# Patient Record
Sex: Male | Born: 1990 | Race: Black or African American | Hispanic: No | Marital: Single | Smoking: Current some day smoker
Health system: Southern US, Community
[De-identification: ages and names within clinical notes are randomized; demographics above are authoritative.]

## PROBLEM LIST (undated history)

## (undated) DIAGNOSIS — J45909 Unspecified asthma, uncomplicated: Secondary | ICD-10-CM

## (undated) HISTORY — PX: HERNIA REPAIR: SHX51

## (undated) HISTORY — PX: TESTICLE SURGERY: SHX794

---
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
## 2014-04-25 ENCOUNTER — Emergency Department (HOSPITAL_BASED_OUTPATIENT_CLINIC_OR_DEPARTMENT_OTHER): Payer: 59

## 2014-04-25 ENCOUNTER — Emergency Department (HOSPITAL_BASED_OUTPATIENT_CLINIC_OR_DEPARTMENT_OTHER)
Admission: EM | Admit: 2014-04-25 | Discharge: 2014-04-26 | Disposition: A | Discharge status: Home / Self Care | Payer: 59 | Attending: Emergency Medicine | Admitting: Emergency Medicine

## 2014-04-25 ENCOUNTER — Encounter (HOSPITAL_BASED_OUTPATIENT_CLINIC_OR_DEPARTMENT_OTHER): Payer: Self-pay

## 2014-04-25 DIAGNOSIS — Z720 Tobacco use: Secondary | ICD-10-CM | POA: Diagnosis not present

## 2014-04-25 DIAGNOSIS — K6289 Other specified diseases of anus and rectum: Secondary | ICD-10-CM | POA: Insufficient documentation

## 2014-04-25 DIAGNOSIS — R1084 Generalized abdominal pain: Secondary | ICD-10-CM | POA: Diagnosis present

## 2014-04-25 DIAGNOSIS — K5900 Constipation, unspecified: Secondary | ICD-10-CM | POA: Diagnosis not present

## 2014-04-25 DIAGNOSIS — F419 Anxiety disorder, unspecified: Secondary | ICD-10-CM | POA: Diagnosis not present

## 2014-04-25 DIAGNOSIS — J45909 Unspecified asthma, uncomplicated: Secondary | ICD-10-CM | POA: Diagnosis not present

## 2014-04-25 HISTORY — DX: Unspecified asthma, uncomplicated: J45.909

## 2014-04-25 LAB — COMPREHENSIVE METABOLIC PANEL
ALBUMIN: 4.2 g/dL (ref 3.5–5.2)
ALK PHOS: 40 U/L (ref 39–117)
ALT: 31 U/L (ref 0–53)
ANION GAP: 7 (ref 5–15)
AST: 30 U/L (ref 0–37)
BILIRUBIN TOTAL: 0.7 mg/dL (ref 0.3–1.2)
BUN: 9 mg/dL (ref 6–23)
CHLORIDE: 102 mmol/L (ref 96–112)
CO2: 30 mmol/L (ref 19–32)
Calcium: 9.2 mg/dL (ref 8.4–10.5)
Creatinine, Ser: 0.85 mg/dL (ref 0.50–1.35)
GFR calc Af Amer: >90 mL/min (ref >90)
Glucose, Bld: 93 mg/dL (ref 70–99)
POTASSIUM: 3.6 mmol/L (ref 3.5–5.1)
Sodium: 139 mmol/L (ref 135–145)
Total Protein: 8 g/dL (ref 6.0–8.3)

## 2014-04-25 LAB — CBC WITH DIFFERENTIAL/PLATELET
BASOS PCT: 0 % (ref 0–1)
Basophils Absolute: 0 10*3/uL (ref 0.0–0.1)
Eosinophils Absolute: 0.4 10*3/uL (ref 0.0–0.7)
Eosinophils Relative: 3 % (ref 0–5)
HEMATOCRIT: 40.4 % (ref 39.0–52.0)
HEMOGLOBIN: 13.2 g/dL (ref 13.0–17.0)
LYMPHS PCT: 27 % (ref 12–46)
Lymphs Abs: 3.6 10*3/uL (ref 0.7–4.0)
MCH: 29.7 pg (ref 26.0–34.0)
MCHC: 32.7 g/dL (ref 30.0–36.0)
MCV: 90.8 fL (ref 78.0–100.0)
MONOS PCT: 11 % (ref 3–12)
Monocytes Absolute: 1.5 10*3/uL — ABNORMAL HIGH (ref 0.1–1.0)
NEUTROS ABS: 7.8 10*3/uL — AB (ref 1.7–7.7)
Neutrophils Relative %: 59 % (ref 43–77)
Platelets: 390 10*3/uL (ref 150–400)
RBC: 4.45 MIL/uL (ref 4.22–5.81)
RDW: 12.2 % (ref 11.5–15.5)
WBC: 13.3 10*3/uL — AB (ref 4.0–10.5)

## 2014-04-25 LAB — URINALYSIS, ROUTINE W REFLEX MICROSCOPIC
Glucose, UA: NEGATIVE mg/dL
Hgb urine dipstick: NEGATIVE
KETONES UR: 15 mg/dL — AB
LEUKOCYTES UA: NEGATIVE
NITRITE: NEGATIVE
Protein, ur: 30 mg/dL — AB
Specific Gravity, Urine: 1.03 (ref 1.005–1.030)
UROBILINOGEN UA: 0.2 mg/dL (ref 0.0–1.0)
pH: 7 (ref 5.0–8.0)

## 2014-04-25 LAB — URINE MICROSCOPIC-ADD ON

## 2014-04-25 MED ORDER — SODIUM CHLORIDE 0.9 % IV BOLUS (SEPSIS)
1000.0000 mL | Freq: Once | INTRAVENOUS | Status: AC
  Administered 2014-04-25: 1000 mL via INTRAVENOUS

## 2014-04-25 MED ORDER — METRONIDAZOLE 500 MG PO TABS
500.0000 mg | ORAL_TABLET | Freq: Once | ORAL | Status: AC
Start: 2014-04-25 — End: 2014-04-25
  Administered 2014-04-25: 500 mg via ORAL
  Filled 2014-04-25: qty 1

## 2014-04-25 MED ORDER — CIPROFLOXACIN HCL 500 MG PO TABS
500.0000 mg | ORAL_TABLET | Freq: Once | ORAL | Status: AC
  Administered 2014-04-25: 500 mg via ORAL
  Filled 2014-04-25: qty 1

## 2014-04-25 MED ORDER — METRONIDAZOLE 500 MG PO TABS: 500.0000 mg | ORAL_TABLET | Freq: Three times a day (TID) | ORAL | Status: AC

## 2014-04-25 MED ORDER — IOHEXOL 300 MG/ML  SOLN
100.0000 mL | Freq: Once | INTRAMUSCULAR | Status: AC | PRN
  Administered 2014-04-25: 100 mL via INTRAVENOUS

## 2014-04-25 MED ORDER — IOHEXOL 300 MG/ML  SOLN
25.0000 mL | Freq: Once | INTRAMUSCULAR | Status: AC | PRN
  Administered 2014-04-25: 25 mL via ORAL

## 2014-04-25 MED ORDER — ALBUTEROL SULFATE HFA 108 (90 BASE) MCG/ACT IN AERS
1.0000 | INHALATION_SPRAY | Freq: Once | RESPIRATORY_TRACT | Status: AC
  Administered 2014-04-25: 1 via RESPIRATORY_TRACT
  Filled 2014-04-25: qty 6.7

## 2014-04-25 MED ORDER — CIPROFLOXACIN HCL 500 MG PO TABS: 500.0000 mg | ORAL_TABLET | Freq: Two times a day (BID) | ORAL | Status: AC

## 2014-04-25 NOTE — ED Notes (Signed)
After getting iv contrast pt states was having diff breathing,  md notified,  Pt requested an inhaletr,  Received order from md for same

## 2014-04-25 NOTE — Discharge Instructions (Signed)
Proctitis °Proctitis is the swelling and soreness (inflammation) of the lining of the rectum. The rectum is at the end of the large intestine and is attached to the anus. The inflammation causes pain and discomfort. It may be short-term (acute) or long-lasting (chronic). °CAUSES °Inflammation in the rectum can be caused by many things, such as: °· Sexually transmitted diseases (STDs). °· Infection. °· Anal-rectal trauma or injury. °· Ulcerative colitis or Crohn's disease. °· Radiation therapy directed near the rectum. °· Antibiotic therapy. °SYMPTOMS °· Sudden, uncomfortable, and frequent urge to have a bowel movement. °· Anal or rectal pain. °· Abdominal cramping or pain. °· Sensation that the rectum is full. °· Rectal bleeding. °· Pus or mucus discharge from anus. °· Diarrhea or frequent soft, loose stools. °DIAGNOSIS °Diagnosis may include the following: °· A history and physical exam. °· An STD test. °· Blood tests. °· Stool tests. °· Rectal culture. °· A procedure to evaluate the anal canal (anoscopy). °· Procedures to look at part, or the entire large bowel (sigmoidoscopy, colonoscopy). °TREATMENT °Treatment of proctitis depends on the cause. Reducing the symptoms of inflammation and eliminating infection are the main goals of treatment. Treatment may include: °· Home remedies and lifestyle, such as sitz baths and avoiding food right before bedtime. °· Topical ointments, foams, suppositories, or enemas, such as corticosteroids or anti-inflammatories. °· Antibiotic or antiviral medicines to treat infection or to control harmful bacteria. °· Medicines to control diarrhea, soften stools, and reduce pain. °· Medicines to suppress the immune system. °· Avoiding the activity that caused rectal trauma. °· Nutritional, dietary, or herbal supplements. °· Heat or laser therapy for persistent bleeding. °· A dilation procedure to enlarge a narrowed rectum. °· Surgery, though rare, may be necessary to repair damaged rectal  lining. °HOME CARE INSTRUCTIONS °Only take medicines that are recommended or approved by your caregiver. Do not take anti-diarrhea medicine without your caregiver's approval. °SEEK MEDICAL CARE IF: °· You often experience one or more of the symptoms noted above. °· You keep experiencing symptoms after treatment. °· You have questions or concerns about your symptoms or treatment plan. °MAKE SURE YOU: °· Understand these instructions. °· Will watch your condition. °· Will get help right away if you are not doing well or get worse. °FOR MORE INFORMATION °National Institute of Diabetes and Digestive and Kidney Disease (NIDDK): www.digestive.niddk.hih.gov/ °Document Released: 12/11/2010 Document Revised: 04/18/2012 Document Reviewed: 12/11/2010 °ExitCare® Patient Information ©2015 [employer]. This information is not intended to replace advice given to you by your health care provider. Make sure you discuss any questions you have with your health care provider. ° °

## 2014-04-25 NOTE — ED Notes (Addendum)
MD at bedside discussing pts sore and swollen throat and Dc plan of care.  Pt swallowed meds without difficulty.

## 2014-04-25 NOTE — ED Notes (Signed)
Pt back from CT. Pt threw up in CT and got really upset. Pt complaining that he cant breath. Pt speaking complete sentences. Clear bilateral breath sounds. Pt in no distress. Md ordered albuterol inhaler.

## 2014-04-25 NOTE — ED Provider Notes (Signed)
CSN: [id number]     Arrival date & time 04/25/14  1829 History  This chart was scribed for Tilden FossaElizabeth Cordell Guercio, MD by Phillis HaggisGabriella Gaje, ED Scribe. This patient was seen in room MH08/MH08 and patient care was started at 8:24 PM.     Chief Complaint  Patient presents with  . Abdominal Pain   Patient is a 24 y.o. male presenting with abdominal pain. The history is provided by the patient. No language interpreter was used.  Abdominal Pain Pain location:  Generalized Pain quality: aching   Pain radiates to:  Does not radiate Pain severity:  Moderate Duration:  2 weeks Timing:  Constant Progression:  Worsening Chronicity:  New Worsened by:  Bowel movements Ineffective treatments:  None tried Associated symptoms: constipation   Associated symptoms: no dysuria, no fever, no nausea and no vomiting     HPI Comments: Antonio Wong is a 24 y.o. male who presents to the Emergency Department complaining of constant worsening generalized abdominal pain onset two weeks ago. He states that the pain is constant, even when he sleeps. He states that the pain is worst with bowel movements. He states that his last bowel movement was yesterday and that he has been having hard stools. He states that he has some rectal pain but it is mostly abdominal pain. Patient denies pain with urination, fever, or vomiting.  He denies trying anything for the pain. He states that he has not had any surgeries to the abdomen. Patient reports history of asthma.    Past Medical History  Diagnosis Date  . Asthma    Past Surgical History  Procedure Laterality Date  . Testicle surgery     No family history on file. History  Substance Use Topics  . Smoking status: Current Some Day Smoker    Types: Cigarettes  . Smokeless tobacco: Not on file  . Alcohol Use: No    Review of Systems  Constitutional: Negative for fever.  Gastrointestinal: Positive for abdominal pain, constipation and rectal pain. Negative for nausea and vomiting.   Genitourinary: Negative for dysuria and difficulty urinating.  All other systems reviewed and are negative.  Allergies  Review of patient's allergies indicates no known allergies.  Home Medications   Prior to Admission medications   Not on File   BP 140/82 mmHg  Pulse 74  Temp(Src) 99.3 F (37.4 C) (Oral)  Resp 18  Ht 5\' 6"  (1.676 m)  Wt 290 lb (131.543 kg)  BMI 46.83 kg/m2  SpO2 100%  Physical Exam  Constitutional: He is oriented to person, place, and time. He appears well-developed and well-nourished.  HENT:  Head: Normocephalic and atraumatic.  Cardiovascular: Normal rate and regular rhythm.   No murmur heard. Pulmonary/Chest: Effort normal and breath sounds normal. No respiratory distress.  Abdominal: Soft. There is no tenderness. There is no rebound and no guarding.  Mild RLQ tenderness  Genitourinary: Rectal exam shows no external hemorrhoid.  Empty rectal vault  Musculoskeletal: He exhibits no edema or tenderness.  Neurological: He is alert and oriented to person, place, and time.  Skin: Skin is warm and dry.  Psychiatric: His behavior is normal. His mood appears anxious.  Nursing note and vitals reviewed.   ED Course  Procedures (including critical care time) DIAGNOSTIC STUDIES: Oxygen Saturation is 100% on room air, normal by my interpretation.    COORDINATION OF CARE: 8:31 PM-Discussed treatment plan which includes rectal exam and labs with pt at bedside and pt agreed to plan.   Labs Review  Labs Reviewed  URINALYSIS, ROUTINE W REFLEX MICROSCOPIC - Abnormal; Notable for the following:    Color, Urine AMBER (*)    APPearance CLOUDY (*)    Bilirubin Urine SMALL (*)    Ketones, ur 15 (*)    Protein, ur 30 (*)    All other components within normal limits  URINE MICROSCOPIC-ADD ON - Abnormal; Notable for the following:    Squamous Epithelial / LPF FEW (*)    All other components within normal limits  CBC WITH DIFFERENTIAL/PLATELET - Abnormal; Notable  for the following:    WBC 13.3 (*)    Neutro Abs 7.8 (*)    Monocytes Absolute 1.5 (*)    All other components within normal limits  COMPREHENSIVE METABOLIC PANEL   Imaging Review Ct Abdomen Pelvis W Contrast  04/25/2014   CLINICAL DATA:  24 year old male with 2 week history of abdominal pain and constipation  EXAM: CT ABDOMEN AND PELVIS WITH CONTRAST  TECHNIQUE: Multidetector CT imaging of the abdomen and pelvis was performed using the standard protocol following bolus administration of intravenous contrast.  CONTRAST:  25mL OMNIPAQUE IOHEXOL 300 MG/ML SOLN, OMNIPAQUE IOHEXOL 300 MG/ML SOLN  COMPARISON:  Acute abdominal series obtained earlier today  FINDINGS: Lower Chest: The lung bases are clear. Visualized cardiac structures are within normal limits for size. No pericardial effusion. Unremarkable visualized distal thoracic esophagus.  Abdomen: Unremarkable CT appearance of the stomach, duodenum, spleen, adrenal glands and pancreas. Normal hepatic contour morphology. No discrete hepatic lesion. Gallbladder is unremarkable. No intra or extrahepatic biliary ductal dilatation.  Unremarkable appearance of the bilateral kidneys. No focal solid lesion, hydronephrosis or nephrolithiasis.  Submucosal edema, haziness and surrounding stranding in the adjacent fat beginning at the rectum and extending to involve the sigmoid colon. No definite diverticula. There is an approximately 1.8 x 1.6 cm intermediate attenuation collection position between the rectum and sigmoid colon consistent with a developing phlegmon. The more proximal sigmoid colon and descending colon are unaffected. Normal appendix in the right lower quadrant. No free fluid or suspicious adenopathy.  Pelvis: Unremarkable bladder. Inflammatory stranding in the perirectal and perisigmoid fat as described above.  Bones/Soft Tissues: No acute fracture or aggressive appearing lytic or blastic osseous lesion.  Vascular: No significant atherosclerotic  vascular disease, aneurysmal dilatation or acute abnormality.  IMPRESSION: CT findings are consistent with proctitis and distal sigmoid colitis. There is an approximately 1.8 x 1.6 cm developing low-attenuation collection interposed between the rectum and the affected loop of adjacent sigmoid colon concerning for developing phlegmon. There is presently no well-defined abscess that would be amenable to image guided drainage. Primary differential considerations include infectious colitis, and inflammatory colitis (ulcerative colitis). Penetrating trauma is thought less likely given the absence of free air.   Electronically Signed   By: Malachy Moan M.D.   On: 04/25/2014 23:01   Dg Abd Acute W/chest  04/25/2014   CLINICAL DATA:  24 year old male with a history of abdominal pain.  EXAM: DG ABDOMEN ACUTE W/ 1V CHEST  COMPARISON:  None.  FINDINGS: Chest:  Cardiomediastinal silhouette within normal limits in size and contour.  No evidence of pulmonary vascular congestion.  No confluent airspace disease, pleural effusion, pneumothorax.  Abdomen:  Gas throughout the length of the colon without large stool burden.  Small amount of central small bowel gas.  Gas within the stomach  Silhouette of the liver, spleen, bilateral kidneys unremarkable.  No unexpected calcifications.  No definite evidence of free air.  No displaced fracture identified.  IMPRESSION: Chest:  No radiographic evidence of acute cardiopulmonary disease.  Abdomen:  Normal bowel gas pattern.  Signed,  Yvone Neu. Loreta Ave, DO  [facility]  [facility]   Electronically Signed   By: Gilmer Mor D.O.   On: 04/25/2014 21:39     EKG Interpretation None      MDM   Final diagnoses:  Proctitis   Patient here for evaluation of rectal pain and abdominal pain. Patient is not significantly tender on rectal exam. Patient denies any sort of rectal intercourse, penile discharge, rectal foreign bodies. Discussed  with patient finding of CT scan with findings concerning for colitis, there is questioning phlegmon but no evidence of abscess. Discussed with patient importance of starting antibiotics with close gastroenterology and PCP follow-up. Return precautions were discussed for progressive symptoms.  I personally performed the services described in this documentation, which was scribed in my presence. The recorded information has been reviewed and is accurate.    Tilden Fossa, MD 04/26/14 [phone]

## 2014-04-25 NOTE — ED Notes (Signed)
C/o abd pain  When having bm, states doesn't feel like emptying bladder w urination x 2 weeks

## 2014-04-25 NOTE — ED Notes (Signed)
Pt reports for two weeks been having abdominal pain and diffculty having a bowel movement.  Pt reports he took a laxative and had a bowel movement today but when he pushes to have a bowel movement he has severe abdominal pain.  Reprots pain also when he coughs.

## 2014-04-26 LAB — GC/CHLAMYDIA PROBE AMP (~~LOC~~) NOT AT ARMC
CHLAMYDIA, DNA PROBE: NEGATIVE
NEISSERIA GONORRHEA: NEGATIVE

## 2014-05-11 ENCOUNTER — Encounter (HOSPITAL_BASED_OUTPATIENT_CLINIC_OR_DEPARTMENT_OTHER): Payer: Self-pay

## 2014-05-11 ENCOUNTER — Emergency Department (HOSPITAL_BASED_OUTPATIENT_CLINIC_OR_DEPARTMENT_OTHER): Payer: 59

## 2014-05-11 ENCOUNTER — Emergency Department (HOSPITAL_BASED_OUTPATIENT_CLINIC_OR_DEPARTMENT_OTHER)
Admission: EM | Admit: 2014-05-11 | Discharge: 2014-05-11 | Disposition: A | Discharge status: Home / Self Care | Payer: 59 | Attending: Emergency Medicine | Admitting: Emergency Medicine

## 2014-05-11 DIAGNOSIS — S40022A Contusion of left upper arm, initial encounter: Secondary | ICD-10-CM | POA: Insufficient documentation

## 2014-05-11 DIAGNOSIS — Z720 Tobacco use: Secondary | ICD-10-CM | POA: Diagnosis not present

## 2014-05-11 DIAGNOSIS — S59912A Unspecified injury of left forearm, initial encounter: Secondary | ICD-10-CM | POA: Diagnosis present

## 2014-05-11 DIAGNOSIS — Z792 Long term (current) use of antibiotics: Secondary | ICD-10-CM | POA: Insufficient documentation

## 2014-05-11 DIAGNOSIS — Y9389 Activity, other specified: Secondary | ICD-10-CM | POA: Diagnosis not present

## 2014-05-11 DIAGNOSIS — W228XXA Striking against or struck by other objects, initial encounter: Secondary | ICD-10-CM | POA: Diagnosis not present

## 2014-05-11 DIAGNOSIS — Y998 Other external cause status: Secondary | ICD-10-CM | POA: Insufficient documentation

## 2014-05-11 DIAGNOSIS — Y9289 Other specified places as the place of occurrence of the external cause: Secondary | ICD-10-CM | POA: Insufficient documentation

## 2014-05-11 DIAGNOSIS — J45909 Unspecified asthma, uncomplicated: Secondary | ICD-10-CM | POA: Insufficient documentation

## 2014-05-11 DIAGNOSIS — Z79899 Other long term (current) drug therapy: Secondary | ICD-10-CM | POA: Insufficient documentation

## 2014-05-11 NOTE — ED Provider Notes (Signed)
CSN: [id number]     Arrival date & time 05/11/14  1436 History   First MD Initiated Contact with Patient 05/11/14 1612     Chief Complaint  Patient presents with  . Arm Injury     (Consider location/radiation/quality/duration/timing/severity/associated sxs/prior Treatment) HPI  Antonio Wong is a 24 y.o. male presenting with left arm pain and edema and erythema after being hit with a forklift at work 05/09/14. Patient states the pain is worse at night and with palpation. He is not taken anything for his symptoms. Patient denies other injury. He denies fevers, chills, nausea, vomiting, tingling or weakness. Pt states he was terminated from his job yesterday because he did not get UDS after the incident.    Past Medical History  Diagnosis Date  . Asthma    Past Surgical History  Procedure Laterality Date  . Testicle surgery    . Hernia repair     History reviewed. No pertinent family history. History  Substance Use Topics  . Smoking status: Current Some Day Smoker    Types: Cigarettes  . Smokeless tobacco: Not on file  . Alcohol Use: No    Review of Systems  Musculoskeletal: Positive for myalgias. Negative for joint swelling.  Skin: Positive for color change. Negative for wound.  Neurological: Negative for weakness and numbness.      Allergies  Review of patient's allergies indicates no known allergies.  Home Medications   Prior to Admission medications   Medication Sig Start Date End Date Taking? Authorizing Provider  ciprofloxacin (CIPRO) 500 MG tablet Take 1 tablet (500 mg total) by mouth 2 (two) times daily. 04/25/14   Antonio FossaElizabeth Rees, MD  metroNIDAZOLE (FLAGYL) 500 MG tablet Take 1 tablet (500 mg total) by mouth 3 (three) times daily. 04/25/14   Antonio FossaElizabeth Rees, MD   BP 139/87 mmHg  Pulse 74  Temp(Src) 98.1 F (36.7 C) (Oral)  Resp 18  Ht 5\' 5"  (1.651 m)  Wt 244 lb 4.8 oz (110.814 kg)  BMI 40.65 kg/m2  SpO2 100% Physical Exam  Constitutional: He appears  well-developed and well-nourished. No distress.  HENT:  Head: Normocephalic and atraumatic.  Eyes: Conjunctivae are normal. Right eye exhibits no discharge. Left eye exhibits no discharge.  Cardiovascular:  2+ radial pulses equal bilaterally.  Pulmonary/Chest: Effort normal. No respiratory distress.  Musculoskeletal:  Large contusion to left distal upper extremity. Contusion is not circumferential. Compartment is soft. No abrasion, wound. Sensation intact. 5 out of 5 strength in bilateral upper extremities is equal. Full range of motion of left fingers, wrist, elbow without pain.  Neurological: He is alert. Coordination normal.  Skin: He is not diaphoretic.  Psychiatric: He has a normal mood and affect. His behavior is normal.  Nursing note and vitals reviewed.   ED Course  Procedures (including critical care time) Labs Review Labs Reviewed - No data to display  Imaging Review Dg Forearm Left  05/11/2014   CLINICAL DATA:  Right forearm pain, bruising and swelling after being hit by a forklift 2 days ago.  EXAM: LEFT FOREARM - 2 VIEW  COMPARISON:  None.  FINDINGS: Diffuse soft tissue swelling medially and dorsally. No fracture or dislocation seen.  IMPRESSION: No fracture.   Electronically Signed   By: Beckie SaltsSteven  Reid M.D.   On: 05/11/2014 15:21     EKG Interpretation None      MDM   Final diagnoses:  Contusion of left upper extremity, initial encounter   Patient X-Ray negative for obvious fracture or dislocation.  Neurovascularly intact. Contusion not circumferential compartment soft. I doubt septic arthritis and compartment syndrome. Pt advised to follow up with PCP/orthopedics if symptoms persist. Patient given ace bandage while in ED, RICE, ibuprofen and conservative therapy recommended and discussed.   Discussed return precautions with patient including paresthesias, pallor, firm compartment, weakness. Discussed all results and patient verbalizes understanding and agrees with  plan.    Oswaldo ConroyVictoria Jozef Eisenbeis, PA-C 05/11/14 1715  Rolan BuccoMelanie Belfi, MD 05/11/14 [phone]

## 2014-05-11 NOTE — ED Notes (Signed)
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

## 2014-05-11 NOTE — ED Notes (Signed)
Pt now states that he was terminated from job yesterday.

## 2014-05-11 NOTE — Discharge Instructions (Signed)
Return to the emergency room with worsening of symptoms, new symptoms or with symptoms that are concerning, especially numbness, tingling, firm feeling to upper extremity, decreased sensation, weakness, severe pain. RICE: Rest, Ice (three cycles of 20 mins on, 20mins off at least twice a day), compression/brace, elevation. Heating pad works well for back pain. Ibuprofen 400mg  (2 tablets 200mg ) every 5-6 hours for 3-5 days. Follow up with PCP/orthopedist.  Read below information and follow recommendations.   Contusion A contusion is a deep bruise. Contusions are the result of an injury that caused bleeding under the skin. The contusion may turn blue, purple, or yellow. Minor injuries will give you a painless contusion, but more severe contusions may stay painful and swollen for a few weeks.  CAUSES  A contusion is usually caused by a blow, trauma, or direct force to an area of the body. SYMPTOMS   Swelling and redness of the injured area.  Bruising of the injured area.  Tenderness and soreness of the injured area.  Pain. DIAGNOSIS  The diagnosis can be made by taking a history and physical exam. An X-ray, CT scan, or MRI may be needed to determine if there were any associated injuries, such as fractures. TREATMENT  Specific treatment will depend on what area of the body was injured. In general, the best treatment for a contusion is resting, icing, elevating, and applying cold compresses to the injured area. Over-the-counter medicines may also be recommended for pain control. Ask your caregiver what the best treatment is for your contusion. HOME CARE INSTRUCTIONS   Put ice on the injured area.  Put ice in a plastic bag.  Place a towel between your skin and the bag.  Leave the ice on for 15-20 minutes, 3-4 times a day, or as directed by your health care provider.  Only take over-the-counter or prescription medicines for pain, discomfort, or fever as directed by your caregiver. Your  caregiver may recommend avoiding anti-inflammatory medicines (aspirin, ibuprofen, and naproxen) for 48 hours because these medicines may increase bruising.  Rest the injured area.  If possible, elevate the injured area to reduce swelling. SEEK IMMEDIATE MEDICAL CARE IF:   You have increased bruising or swelling.  You have pain that is getting worse.  Your swelling or pain is not relieved with medicines. MAKE SURE YOU:   Understand these instructions.  Will watch your condition.  Will get help right away if you are not doing well or get worse. Document Released: 10/01/2004 Document Revised: 12/27/2012 Document Reviewed: 10/27/2010 [facility] Patient Information 2015 [place], [place]. This information is not intended to replace advice given to you by your health care provider. Make sure you discuss any questions you have with your health care provider.

## 2014-07-20 ENCOUNTER — Emergency Department (HOSPITAL_BASED_OUTPATIENT_CLINIC_OR_DEPARTMENT_OTHER): Payer: 59

## 2014-07-20 ENCOUNTER — Encounter (HOSPITAL_BASED_OUTPATIENT_CLINIC_OR_DEPARTMENT_OTHER): Payer: Self-pay | Admitting: *Deleted

## 2014-07-20 ENCOUNTER — Emergency Department (HOSPITAL_BASED_OUTPATIENT_CLINIC_OR_DEPARTMENT_OTHER)
Admission: EM | Admit: 2014-07-20 | Discharge: 2014-07-21 | Disposition: A | Discharge status: Home / Self Care | Payer: 59 | Attending: Emergency Medicine | Admitting: Emergency Medicine

## 2014-07-20 DIAGNOSIS — J45909 Unspecified asthma, uncomplicated: Secondary | ICD-10-CM | POA: Diagnosis not present

## 2014-07-20 DIAGNOSIS — Z79899 Other long term (current) drug therapy: Secondary | ICD-10-CM | POA: Diagnosis not present

## 2014-07-20 DIAGNOSIS — Z792 Long term (current) use of antibiotics: Secondary | ICD-10-CM | POA: Diagnosis not present

## 2014-07-20 DIAGNOSIS — M79632 Pain in left forearm: Secondary | ICD-10-CM | POA: Diagnosis not present

## 2014-07-20 DIAGNOSIS — Z720 Tobacco use: Secondary | ICD-10-CM | POA: Insufficient documentation

## 2014-07-20 MED ORDER — IBUPROFEN 800 MG PO TABS
800.0000 mg | ORAL_TABLET | Freq: Once | ORAL | Status: DC
  Filled 2014-07-20: qty 1

## 2014-07-20 NOTE — ED Notes (Addendum)
Left forearm pain. He has a swollen area on his arm that has been there since a mash injury at work a month ago. He was seen here and had a negative xray at the time of the injury.

## 2014-07-20 NOTE — ED Notes (Signed)
Patient c/o 10/10 pain in left arm. States this started with injury at work approximately 1.5 months ago. States area on LFA became swollen "a few weeks ago" a bump appeared under the skin. Area is approximately pea sized and moves freely under skin when pushed. No redness to area, and skin is otherwise intact. Patient states he has taken multiple pain medications given to him by family members, but none today. Also complains of R groin pain 2-3 days, states its "just sore".

## 2014-07-20 NOTE — ED Provider Notes (Signed)
CSN: [id number]     Arrival date & time 07/20/14  2206 History   This chart was scribed for Antonio Booze, MD by Ronney Lion, ED Scribe. This patient was seen in room MH07/MH07 and the patient's care was started at 11:47 PM.    Chief Complaint  Patient presents with  . Arm Pain   The history is provided by the patient. No language interpreter was used.    HPI Comments: Antonio Wong is a 24 y.o. male who presents to the Emergency Department complaining of 9/10 left arm pain following being struck by a forklift at work several months ago. He also complains of a small knot underneath the skin on his left arm. He states the pain is worse at night. He states his uncle gave him Percocet which completely alleviated the pain. Palpation makes his pain worse.   Past Medical History  Diagnosis Date  . Asthma    Past Surgical History  Procedure Laterality Date  . Testicle surgery    . Hernia repair     No family history on file. History  Substance Use Topics  . Smoking status: Current Some Day Smoker    Types: Cigarettes  . Smokeless tobacco: Not on file  . Alcohol Use: No    Review of Systems  Musculoskeletal: Positive for myalgias (left arm pain).  All other systems reviewed and are negative.   Allergies  Review of patient's allergies indicates no known allergies.  Home Medications   Prior to Admission medications   Medication Sig Start Date End Date Taking? Authorizing Provider  ciprofloxacin (CIPRO) 500 MG tablet Take 1 tablet (500 mg total) by mouth 2 (two) times daily. 04/25/14   Tilden Fossa, MD  metroNIDAZOLE (FLAGYL) 500 MG tablet Take 1 tablet (500 mg total) by mouth 3 (three) times daily. 04/25/14   Tilden Fossa, MD   BP 132/67 mmHg  Pulse 66  Temp(Src) 98.5 F (36.9 C) (Oral)  Resp 18  Ht  (1.651 m)  Wt 238 lb 2 oz (108.013 kg)  BMI 39.63 kg/m2  SpO2 100% Physical Exam  Constitutional: He is oriented to person, place, and time. He appears well-developed and  well-nourished. No distress.  HENT:  Head: Normocephalic and atraumatic.  Eyes: EOM are normal. Pupils are equal, round, and reactive to light.  Neck: Normal range of motion. Neck supple. No JVD present.  Cardiovascular: Normal rate, regular rhythm and normal heart sounds.   No murmur heard. Pulmonary/Chest: Effort normal and breath sounds normal. He has no wheezes. He has no rales. He exhibits no tenderness.  Abdominal: Soft. Bowel sounds are normal. He exhibits no distension and no mass. There is no tenderness.  Musculoskeletal: Normal range of motion. He exhibits no edema.  Moderate tenderness ulnar aspect left forearm. Freely moveable subcutaneous nodule on the left, nontender. Distal NVI.   Lymphadenopathy:    He has no cervical adenopathy.  Neurological: He is alert and oriented to person, place, and time. No cranial nerve deficit. He exhibits normal muscle tone. Coordination normal.  Skin: Skin is warm and dry. No rash noted.  Psychiatric: He has a normal mood and affect. His behavior is normal. Judgment and thought content normal.  Nursing note and vitals reviewed.   ED Course  Procedures (including critical care time)  DIAGNOSTIC STUDIES: Oxygen Saturation is 100% on RA, normal by my interpretation.    COORDINATION OF CARE: 11:52 PM - Discussed treatment plan with pt at bedside which includes left arm XR and  anti-inflammatory medication. Will give referral to hand specialist. Pt verbalized understanding and agreed to plan.  Imaging Review Dg Forearm Left  07/21/2014   CLINICAL DATA:  24 year old male with trauma and left forearm pain.  EXAM: LEFT FOREARM - 2 VIEW  COMPARISON:  Radiograph dated 05/11/2014  FINDINGS: There is no fracture or dislocation. Evaluation of the elbow is limited as a true 90 degrees angle lateral projection is not provided. The soft tissues are unremarkable.  IMPRESSION: No acute fracture or dislocation.   Electronically Signed   By: Elgie CollardArash  Radparvar  M.D.   On: 07/21/2014 00:21     MDM   Final diagnoses:  Pain in left forearm    Left forearm pain which is probably related to residual trauma from forklift injury from 2 months ago. Old records reviewed confirming ED visit for contusion of left arm from forklift. Subcutis nodule left arm is of uncertain significance. It does not seem to be interfering with function and have advised the patient to just see if that would resolve over time. Forearm x-rays repeated and shows no evidence of fracture or radiopaque foreign body. He is discharged with prescription for naproxen and tramadol and is referred to hand surgery for follow-up.   I personally performed the services described in this documentation, which was scribed in my presence. The recorded information has been reviewed and is accurate.       Antonio Boozeavid Antonio Bolyard, MD 07/21/14 [phone]

## 2014-07-21 MED ORDER — NAPROXEN 500 MG PO TABS: 500.0000 mg | ORAL_TABLET | Freq: Two times a day (BID) | ORAL | Status: AC

## 2014-07-21 MED ORDER — TRAMADOL HCL 50 MG PO TABS
50.0000 mg | ORAL_TABLET | Freq: Four times a day (QID) | ORAL | Status: AC | PRN
Start: 2014-07-21 — End: ?
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
## 2014-07-21 NOTE — ED Notes (Signed)
Pt refused ibu 800,  States he needs something stronger

## 2015-05-14 IMAGING — CT CT ABD-PELV W/ CM
2 of 4 series · 16 of 46 positions shown, 18 images · IV contrast (APPLIED)
Comparison: Acute abdominal series obtained earlier today

CLINICAL DATA: 24-year-old male with 2 week history of abdominal
pain and constipation

EXAM:
CT ABDOMEN AND PELVIS WITH CONTRAST
TECHNIQUE: Multidetector CT imaging of the abdomen and pelvis was performed
using the standard protocol following bolus administration of
intravenous contrast.
CONTRAST:  25mL OMNIPAQUE IOHEXOL 300 MG/ML SOLN, 100mL OMNIPAQUE
IOHEXOL 300 MG/ML SOLN

[Series 2: abd/pelvis 5.0 b31f · axial · 0.85mm/px · z∈[-214,+206]mm · 13 of 93 slices shown, 15 images]
[im 5/93  soft-tissue]
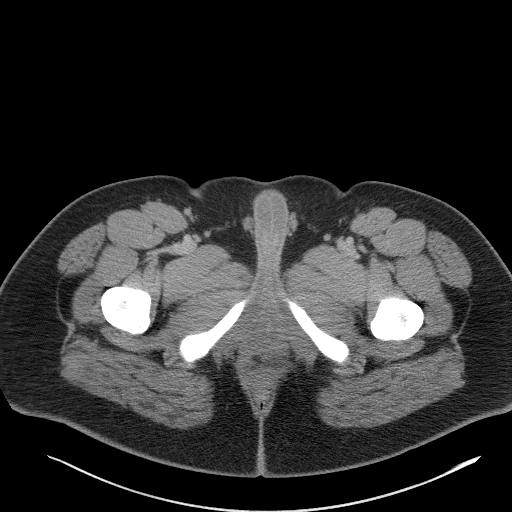
[im 5/93  bone]
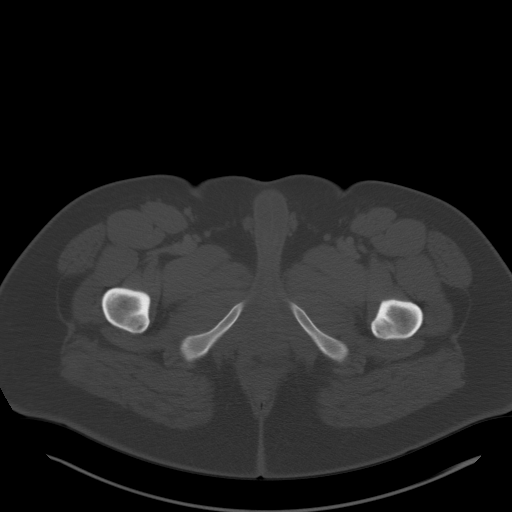
[im 13/93  soft-tissue]
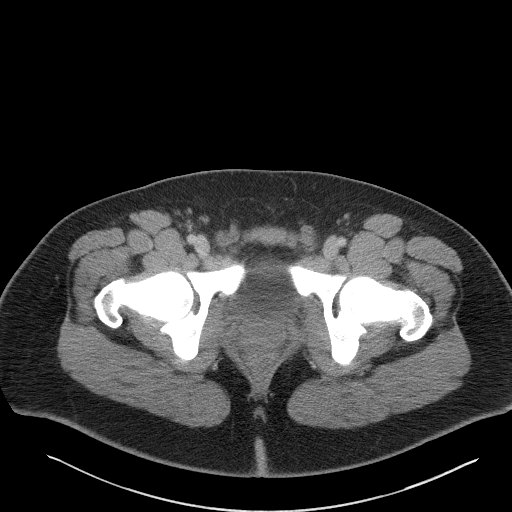
[im 21/93  soft-tissue]
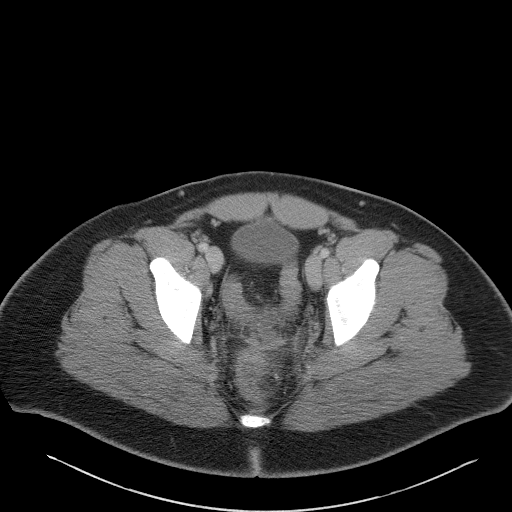
[im 25/93  soft-tissue]
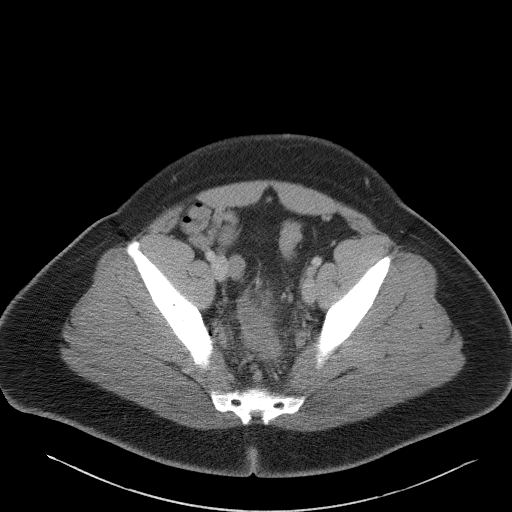
[im 33/93  soft-tissue]
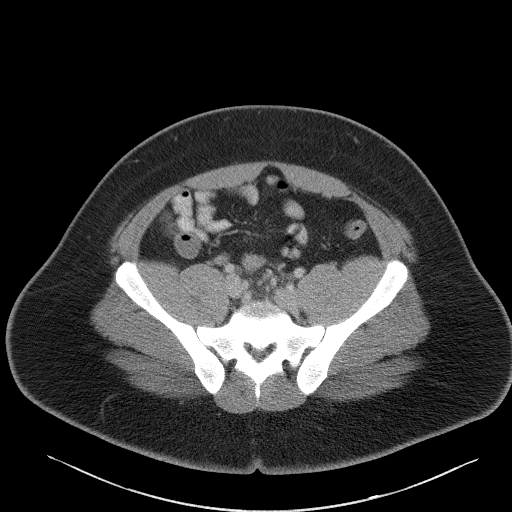
[im 41/93  soft-tissue]
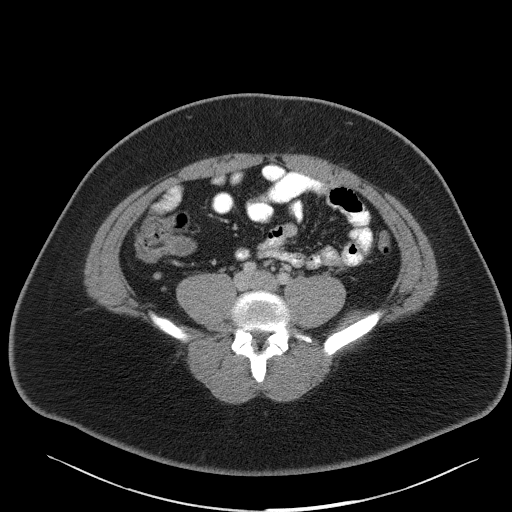
[im 49/93  soft-tissue]
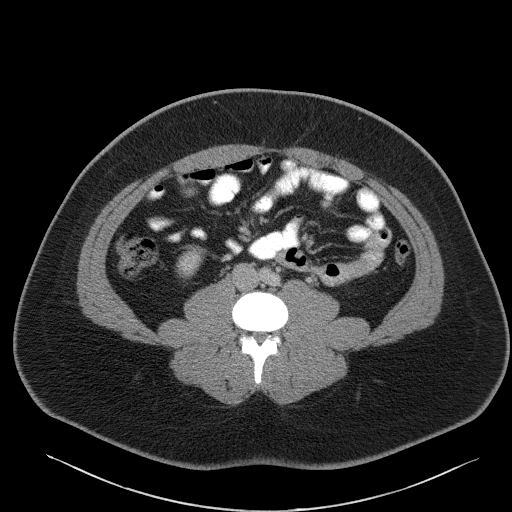
[im 53/93  soft-tissue]
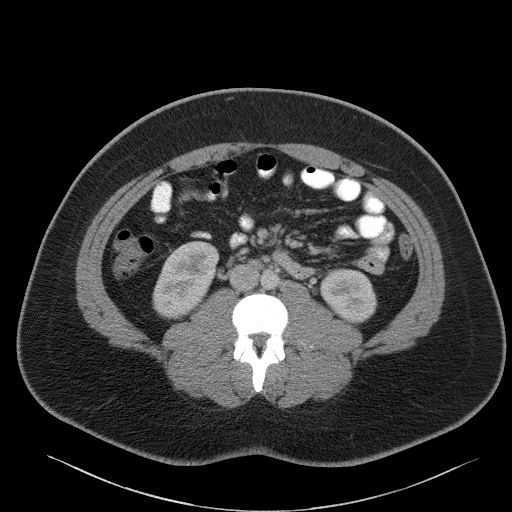
[im 61/93  soft-tissue]
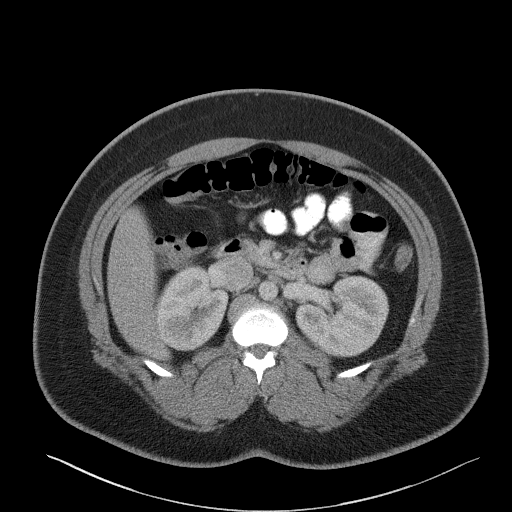
[im 61/93  bone]
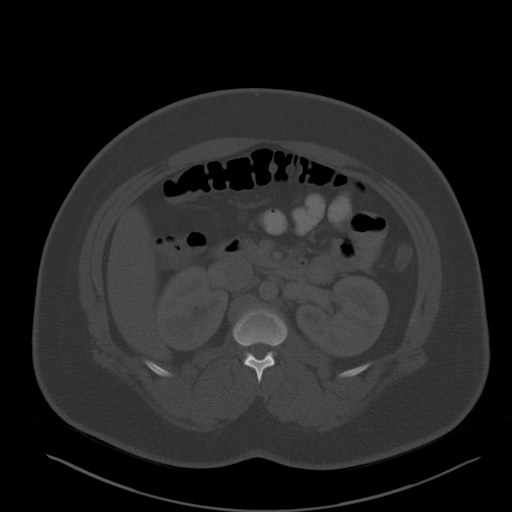
[im 69/93  soft-tissue]
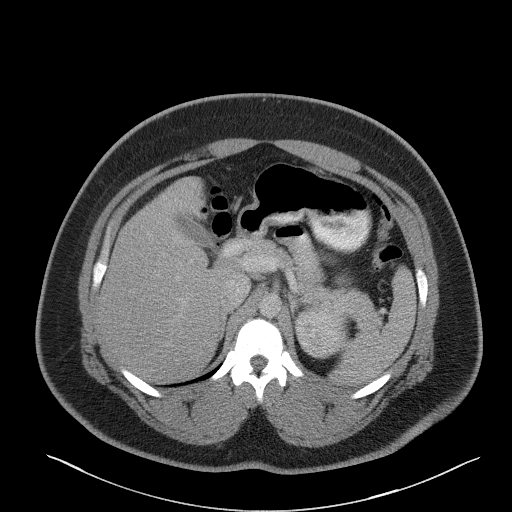
[im 73/93  soft-tissue]
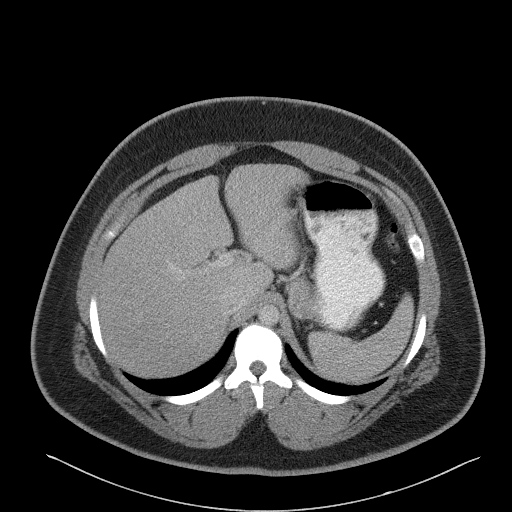
[im 81/93  soft-tissue]
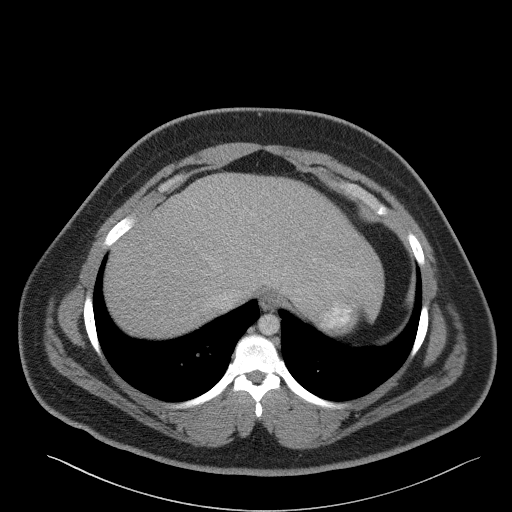
[im 89/93  soft-tissue]
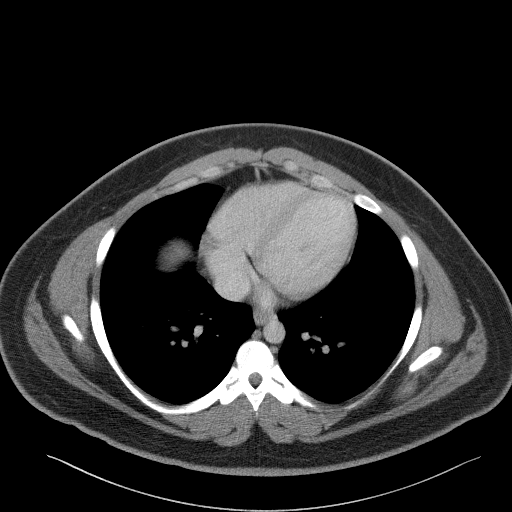

[Series 5: abd/pelvis 3.0 coronal · coronal · 0.82mm/px · 3 of 101 slices shown]
[im 34/101  soft-tissue]
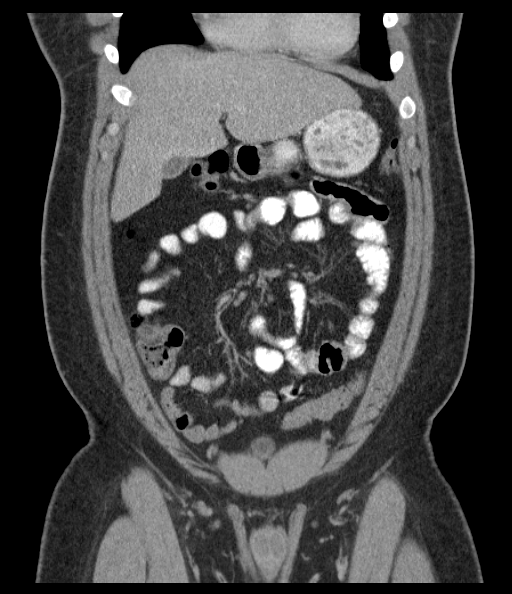
[im 45/101  soft-tissue]
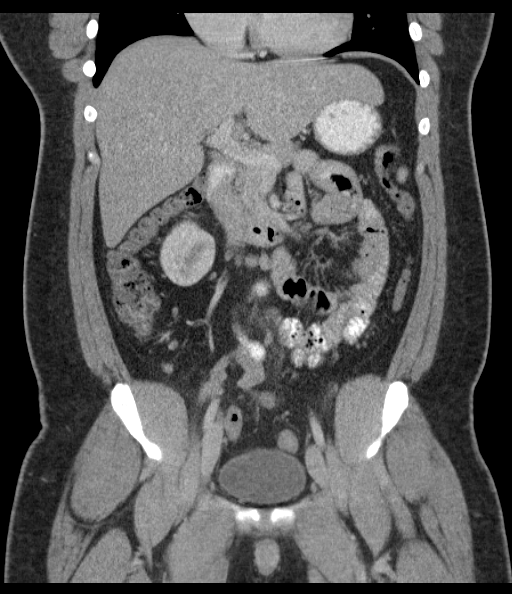
[im 56/101  soft-tissue]
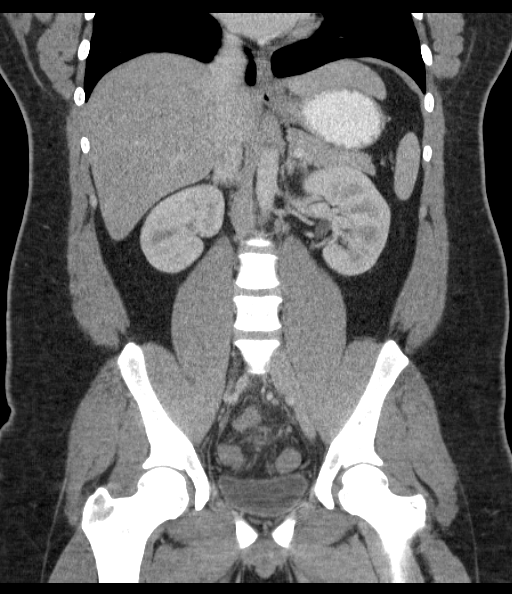

[16 of 46 positions shown; findings below may reference images not displayed]

FINDINGS: Lower Chest: The lung bases are clear. Visualized cardiac structures
are within normal limits for size. No pericardial effusion.
Unremarkable visualized distal thoracic esophagus.

Abdomen: Unremarkable CT appearance of the stomach, duodenum,
spleen, adrenal glands and pancreas. Normal hepatic contour
morphology. No discrete hepatic lesion. Gallbladder is unremarkable.
No intra or extrahepatic biliary ductal dilatation.

Unremarkable appearance of the bilateral kidneys. No focal solid
lesion, hydronephrosis or nephrolithiasis.

Submucosal edema, haziness and surrounding stranding in the adjacent
fat beginning at the rectum and extending to involve the sigmoid
colon. No definite diverticula. There is an approximately 1.8 x
cm intermediate attenuation collection position between the rectum
and sigmoid colon consistent with a developing phlegmon. The more
proximal sigmoid colon and descending colon are unaffected. Normal
appendix in the right lower quadrant. No free fluid or suspicious
adenopathy.

Pelvis: Unremarkable bladder. Inflammatory stranding in the
perirectal and perisigmoid fat as described above.

Bones/Soft Tissues: No acute fracture or aggressive appearing lytic
or blastic osseous lesion.

Vascular: No significant atherosclerotic vascular disease,
aneurysmal dilatation or acute abnormality.
IMPRESSION: CT findings are consistent with proctitis and distal sigmoid
colitis. There is an approximately 1.8 x 1.6 cm developing
low-attenuation collection interposed between the rectum and the
affected loop of adjacent sigmoid colon concerning for developing
phlegmon. There is presently no well-defined abscess that would be
amenable to image guided drainage. Primary differential
considerations include infectious colitis, and inflammatory colitis
(ulcerative colitis). Penetrating trauma is thought less likely
given the absence of free air.

## 2015-05-14 IMAGING — CR DG ABDOMEN ACUTE W/ 1V CHEST
3 series · 3 of 3 positions shown · non-contrast
Comparison: None.

CLINICAL DATA: 24-year-old male with a history of abdominal pain.

EXAM:
DG ABDOMEN ACUTE W/ 1V CHEST

[w chest pa]
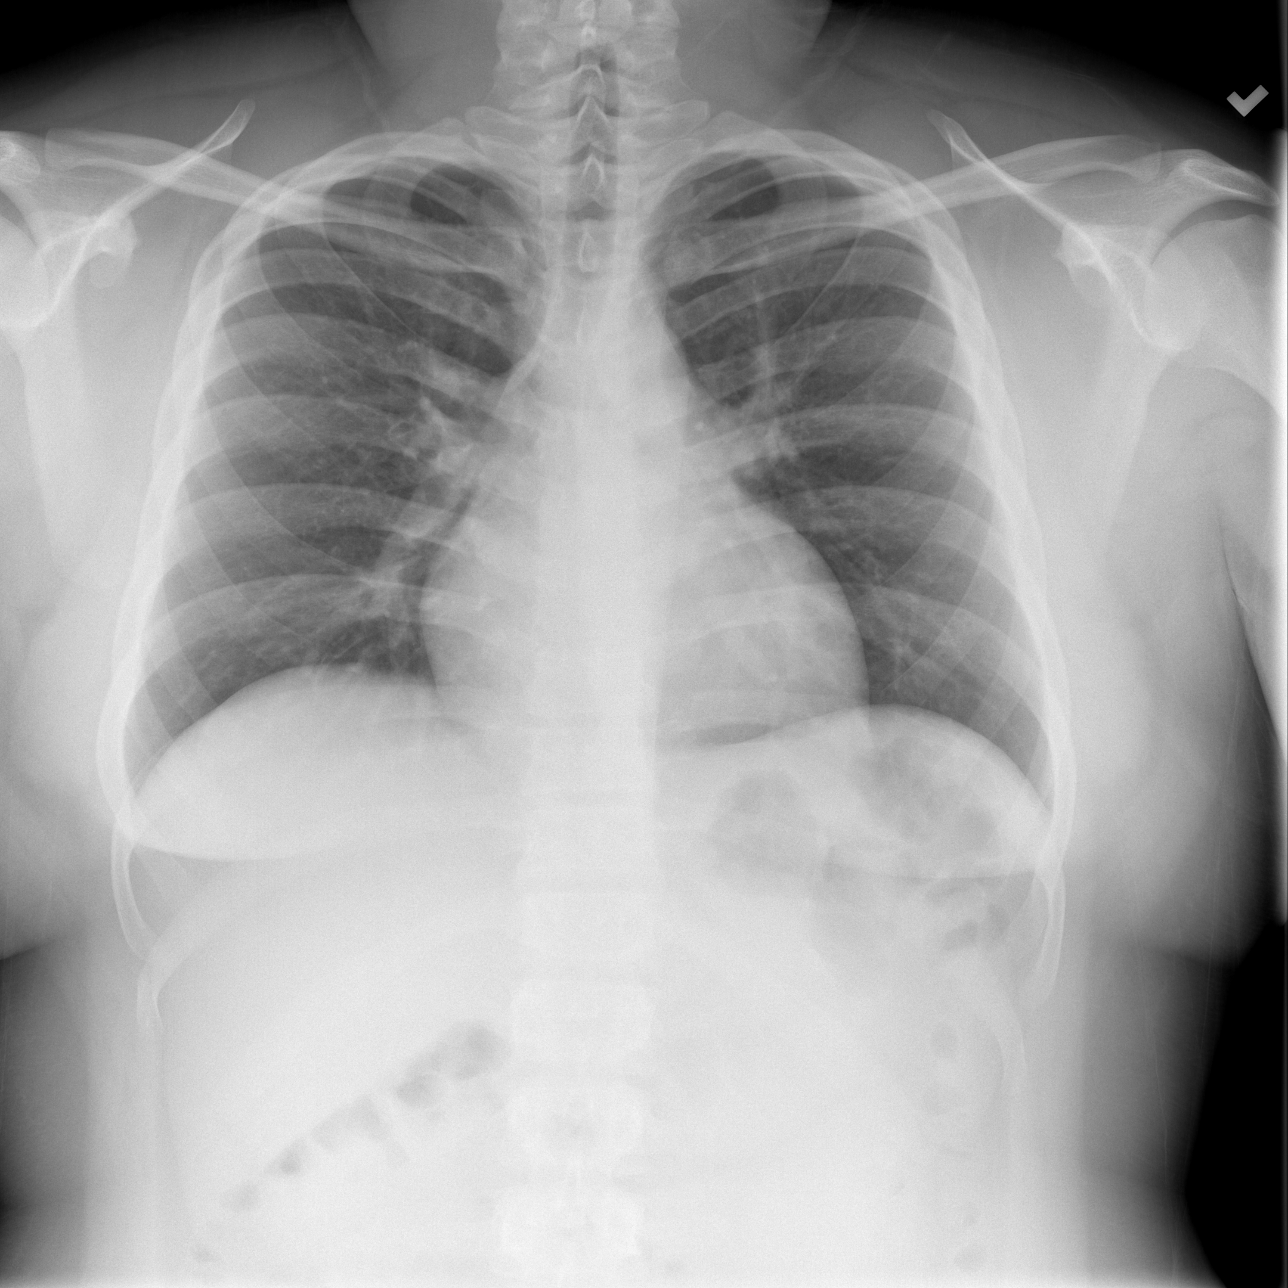

[w abdomen upright]
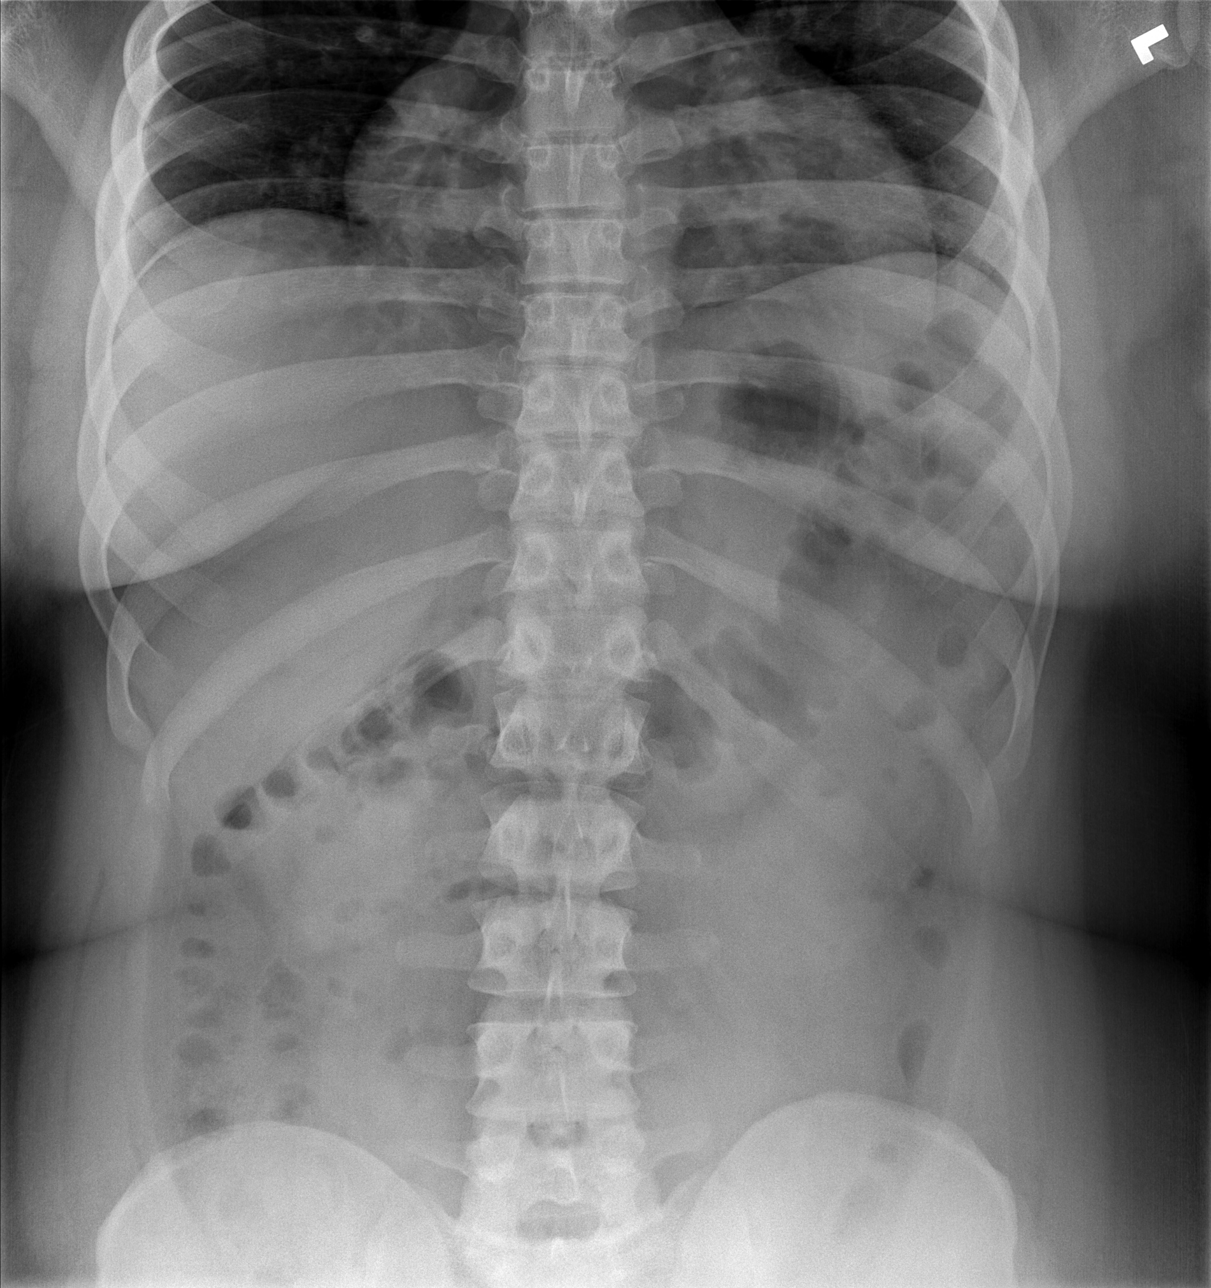

[t abdomen supine]
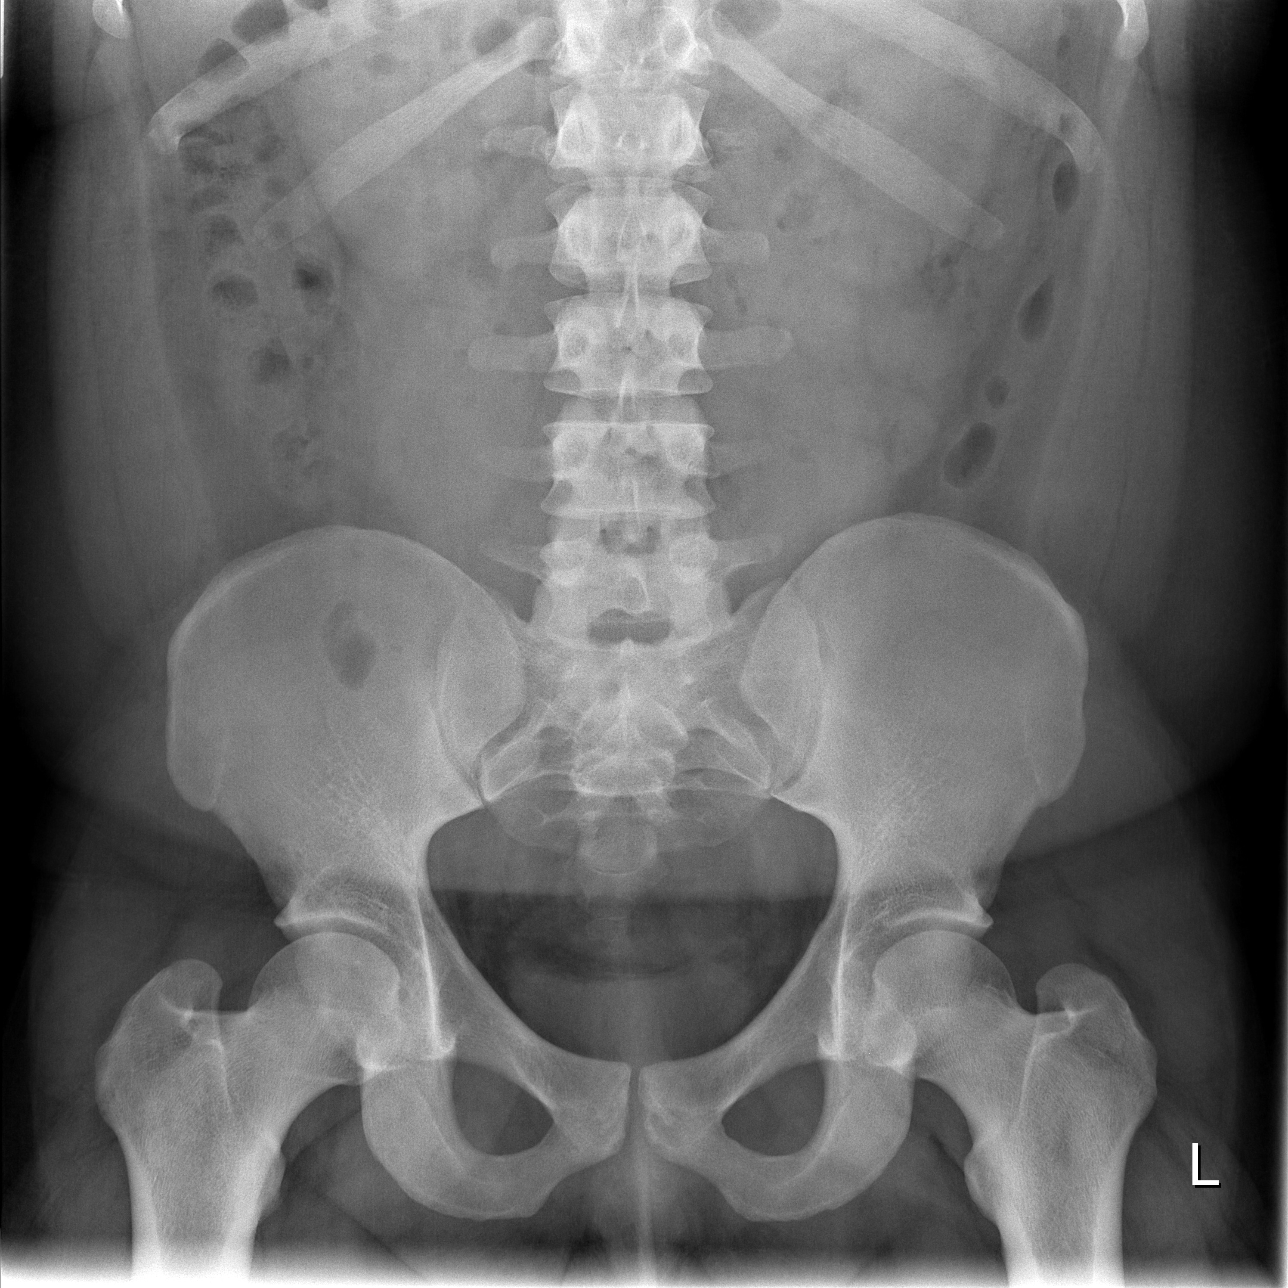

[3 of 3 positions shown; findings below may reference images not displayed]

FINDINGS: Chest:

Cardiomediastinal silhouette within normal limits in size and
contour.

No evidence of pulmonary vascular congestion.

No confluent airspace disease, pleural effusion, pneumothorax.

Abdomen:

Gas throughout the length of the colon without large stool burden.

Small amount of central small bowel gas.  Gas within the stomach

Silhouette of the liver, spleen, bilateral kidneys unremarkable.

No unexpected calcifications.

No definite evidence of free air.

No displaced fracture identified.
IMPRESSION: Chest:

No radiographic evidence of acute cardiopulmonary disease.

Abdomen:

Normal bowel gas pattern.
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
## 2018-04-27 ENCOUNTER — Encounter (HOSPITAL_COMMUNITY): Payer: Self-pay | Admitting: *Deleted

## 2018-04-27 ENCOUNTER — Emergency Department (HOSPITAL_COMMUNITY)
Admission: EM | Admit: 2018-04-27 | Discharge: 2018-04-27 | Disposition: A | Discharge status: Home / Self Care | Payer: 59 | Attending: Emergency Medicine | Admitting: Emergency Medicine

## 2018-04-27 DIAGNOSIS — R198 Other specified symptoms and signs involving the digestive system and abdomen: Secondary | ICD-10-CM

## 2018-04-27 DIAGNOSIS — R301 Vesical tenesmus: Secondary | ICD-10-CM | POA: Insufficient documentation

## 2018-04-27 DIAGNOSIS — F17210 Nicotine dependence, cigarettes, uncomplicated: Secondary | ICD-10-CM | POA: Insufficient documentation

## 2018-04-27 DIAGNOSIS — J45909 Unspecified asthma, uncomplicated: Secondary | ICD-10-CM | POA: Insufficient documentation

## 2018-04-27 DIAGNOSIS — Z79899 Other long term (current) drug therapy: Secondary | ICD-10-CM | POA: Insufficient documentation

## 2018-04-27 LAB — CBC WITH DIFFERENTIAL/PLATELET
Abs Immature Granulocytes: 0.02 10*3/uL (ref 0.00–0.07)
Basophils Absolute: 0 10*3/uL (ref 0.0–0.1)
Basophils Relative: 0 %
Eosinophils Absolute: 0.3 10*3/uL (ref 0.0–0.5)
Eosinophils Relative: 4 %
HCT: 43.7 % (ref 39.0–52.0)
Hemoglobin: 13.8 g/dL (ref 13.0–17.0)
Immature Granulocytes: 0 %
Lymphocytes Relative: 33 %
Lymphs Abs: 2.5 10*3/uL (ref 0.7–4.0)
MCH: 29.8 pg (ref 26.0–34.0)
MCHC: 31.6 g/dL (ref 30.0–36.0)
MCV: 94.4 fL (ref 80.0–100.0)
Monocytes Absolute: 0.6 10*3/uL (ref 0.1–1.0)
Monocytes Relative: 7 %
Neutro Abs: 4.2 10*3/uL (ref 1.7–7.7)
Neutrophils Relative %: 56 %
Platelets: 327 10*3/uL (ref 150–400)
RBC: 4.63 MIL/uL (ref 4.22–5.81)
RDW: 13 % (ref 11.5–15.5)
WBC: 7.6 10*3/uL (ref 4.0–10.5)
nRBC: 0 % (ref 0.0–0.2)

## 2018-04-27 LAB — COMPREHENSIVE METABOLIC PANEL
ALT: 22 U/L (ref 0–44)
AST: 26 U/L (ref 15–41)
Albumin: 3.8 g/dL (ref 3.5–5.0)
Alkaline Phosphatase: 37 U/L — ABNORMAL LOW (ref 38–126)
Anion gap: 9 (ref 5–15)
BUN: 10 mg/dL (ref 6–20)
CO2: 24 mmol/L (ref 22–32)
Calcium: 9.3 mg/dL (ref 8.9–10.3)
Chloride: 108 mmol/L (ref 98–111)
Creatinine, Ser: 0.93 mg/dL (ref 0.61–1.24)
GFR calc Af Amer: >60 mL/min (ref >60)
GFR calc non Af Amer: >60 mL/min (ref >60)
Glucose, Bld: 94 mg/dL (ref 70–99)
Potassium: 3.9 mmol/L (ref 3.5–5.1)
Sodium: 141 mmol/L (ref 135–145)
Total Bilirubin: 0.7 mg/dL (ref 0.3–1.2)
Total Protein: 6.5 g/dL (ref 6.5–8.1)

## 2018-04-27 LAB — URINALYSIS, ROUTINE W REFLEX MICROSCOPIC
Bilirubin Urine: NEGATIVE
Glucose, UA: NEGATIVE mg/dL
Hgb urine dipstick: NEGATIVE
Ketones, ur: NEGATIVE mg/dL
Leukocytes,Ua: NEGATIVE
Nitrite: NEGATIVE
Protein, ur: NEGATIVE mg/dL
Specific Gravity, Urine: 1.028 (ref 1.005–1.030)
pH: 6 (ref 5.0–8.0)

## 2018-04-27 LAB — LIPASE, BLOOD: Lipase: 19 U/L (ref 11–51)

## 2018-04-27 MED ORDER — HYDROCORTISONE ACETATE 25 MG RE SUPP: 25.0000 mg | Freq: Two times a day (BID) | RECTAL | 0 refills | Status: AC | PRN

## 2018-04-27 MED ORDER — POLYETHYLENE GLYCOL 3350 17 G PO PACK: 17.0000 g | PACK | Freq: Every day | ORAL | 0 refills | Status: AC

## 2018-04-27 NOTE — ED Provider Notes (Signed)
[facility] EMERGENCY DEPARTMENT Provider Note   CSN: [id number] Arrival date & time: 04/27/18  1442    History   Chief Complaint Chief Complaint  Patient presents with  . Abdominal Pain    HPI Antonio Wong is a 28 y.o. male.     The history is provided by the patient. No language interpreter was used.  Abdominal Pain     28 year old male presenting for evaluation of abdominal pain.  Patient report for the past 3 days he has had rectal pain and lower abdominal pain.  He admits that stool is been harder lately and while having bowel movement he experienced significant discomfort to his rectum and felt as if something is tearing.  He did notice trace of blood when he wipes.  The pain seems to magnify when he sits down and improves when he walks around.  Pain is 6 out of 10.  He is afraid to having bowel movement secondary to the pain.  He does not complain of any fever or chills, no nausea or vomiting, no urinary discomfort.  Family recommend use a laxative but patient has not used that yet.  No other specific treatment tried.  Past Medical History:  Diagnosis Date  . Asthma     There are no active problems to display for this patient.   Past Surgical History:  Procedure Laterality Date  . HERNIA REPAIR    . TESTICLE SURGERY          Home Medications    Prior to Admission medications   Medication Sig Start Date End Date Taking? Authorizing Provider  ciprofloxacin (CIPRO) 500 MG tablet Take 1 tablet (500 mg total) by mouth 2 (two) times daily. 04/25/14   Tilden Fossa, MD  metroNIDAZOLE (FLAGYL) 500 MG tablet Take 1 tablet (500 mg total) by mouth 3 (three) times daily. 04/25/14   Tilden Fossa, MD  naproxen (NAPROSYN) 500 MG tablet Take 1 tablet (500 mg total) by mouth 2 (two) times daily. 07/21/14   [name], MD  traMADol (ULTRAM) 50 MG tablet Take 1 tablet (50 mg total) by mouth every 6 (six) hours as needed. 07/21/14   [name], MD     Family History History reviewed. No pertinent family history.  Social History Social History   Tobacco Use  . Smoking status: Current Some Day Smoker    Types: Cigarettes  Substance Use Topics  . Alcohol use: No  . 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   Review of Systems Review of Systems  Gastrointestinal: Positive for abdominal pain.  All other systems reviewed and are negative.    Physical Exam Updated Vital Signs BP 123/75 (BP Location: Right Arm)   Pulse 85   Temp 98.2 F (36.8 C) (Oral)   Resp 16   SpO2 100%   Physical Exam Vitals signs and nursing note reviewed.  Constitutional:      General: He is not in acute distress.    Appearance: He is well-developed.  HENT:     Head: Atraumatic.  Eyes:     Conjunctiva/sclera: Conjunctivae normal.  Neck:     Musculoskeletal: Neck supple.  Cardiovascular:     Rate and Rhythm: Normal rate and regular rhythm.  Pulmonary:     Effort: Pulmonary effort is normal.     Breath sounds: Normal breath sounds.  Abdominal:     General: Abdomen  is flat.     Tenderness: There is abdominal tenderness in the suprapubic area. Negative signs include Murphy's sign and McBurney's sign.     Hernia: No hernia is present.  Genitourinary:    Penis: Normal.      Scrotum/Testes: Normal. Cremasteric reflex is present.        Right: Tenderness not present.        Left: Tenderness not present.     Prostate: Normal.     Comments: Chaperone present during exam.  Significant discomfort with digital rectal exam however normal rectal tone, no thrombosed hemorrhoid, no obvious mass, no blood on gloved. Skin:    Findings: No rash.  Neurological:     Mental Status: He is alert.      ED Treatments / Results  Labs (all labs ordered are listed, but only abnormal results are displayed) Labs Reviewed  COMPREHENSIVE METABOLIC PANEL - Abnormal; Notable for the following  components:      Result Value   Alkaline Phosphatase 37 (*)    All other components within normal limits  CBC WITH DIFFERENTIAL/PLATELET  LIPASE, BLOOD  URINALYSIS, ROUTINE W REFLEX MICROSCOPIC    EKG None  Radiology No results found.  Procedures Procedures (including critical care time)  Medications Ordered in ED Medications - No data to display   Initial Impression / Assessment and Plan / ED Course  I have reviewed the triage vital signs and the nursing notes.  Pertinent labs & imaging results that were available during my care of the patient were reviewed by me and considered in my medical decision making (see chart for details).        BP 123/75 (BP Location: Right Arm)   Pulse 85   Temp 98.2 F (36.8 C) (Oral)   Resp 16   SpO2 100%    Final Clinical Impressions(s) / ED Diagnoses   Final diagnoses:  Tenesmus (rectal)    ED Discharge Orders    None     3:26 PM Patient here with complaints of low abdominal pain and rectal pain.  Suspect secondary to constipation.  Able to pass flatus.  No nausea or vomiting to suggest small bowel obstruction.  Patient exhibited quite a bit of discomfort with rectal exam however no obvious bleeding, thrombosed hemorrhoid or anal fissure.  He does have some tenderness to suprapubic region, work-up initiated  4:23 PM Labs are unremarkable, normal WBC, normal H&H, normal electrolyte panel, Normal lipase, urine without signs of urinary tract infection.  I have low suspicion for acute abdominal pathology.  I suspect his discomfort is likely secondary to constipation.  I do not think he has evidence to suggest small bowel obstruction.  I will provide stool softener as well as Anusol for comfort. GI referral given as needed.  Return precaution discussed.    Fayrene Helperran, Sherrine Salberg, PA-C 04/28/18 1605    Gerhard MunchLockwood, Robert, MD 04/28/18 2322

## 2018-04-27 NOTE — ED Triage Notes (Signed)
Pt in c/o generalized abdominal cramping for the last day, denies n/v/d, reports LBM 2 days ago, no distress noted

## 2019-01-15 ENCOUNTER — Encounter (HOSPITAL_COMMUNITY): Payer: Self-pay | Admitting: Emergency Medicine

## 2019-01-15 ENCOUNTER — Other Ambulatory Visit: Payer: Self-pay

## 2019-01-15 ENCOUNTER — Emergency Department (HOSPITAL_COMMUNITY)
Admission: EM | Admit: 2019-01-15 | Discharge: 2019-01-15 | Disposition: A | Discharge status: Home / Self Care | Payer: Self-pay | Attending: Emergency Medicine | Admitting: Emergency Medicine

## 2019-01-15 DIAGNOSIS — J45909 Unspecified asthma, uncomplicated: Secondary | ICD-10-CM | POA: Insufficient documentation

## 2019-01-15 DIAGNOSIS — U071 COVID-19: Secondary | ICD-10-CM | POA: Insufficient documentation

## 2019-01-15 DIAGNOSIS — F17210 Nicotine dependence, cigarettes, uncomplicated: Secondary | ICD-10-CM | POA: Insufficient documentation

## 2019-01-15 DIAGNOSIS — Z79899 Other long term (current) drug therapy: Secondary | ICD-10-CM | POA: Insufficient documentation

## 2019-01-15 LAB — POC SARS CORONAVIRUS 2 AG -  ED: SARS Coronavirus 2 Ag: POSITIVE — AB

## 2019-01-15 MED ORDER — IBUPROFEN 800 MG PO TABS: 800.0000 mg | ORAL_TABLET | Freq: Three times a day (TID) | ORAL | 0 refills | Status: AC | PRN

## 2019-01-15 MED ORDER — ACETAMINOPHEN 500 MG PO TABS: 500.0000 mg | ORAL_TABLET | Freq: Four times a day (QID) | ORAL | 0 refills | Status: AC | PRN

## 2019-01-15 NOTE — ED Provider Notes (Signed)
[facility] EMERGENCY DEPARTMENT Provider Note   CSN: [id number] Arrival date & time: 01/15/19  1112     History Chief Complaint  Patient presents with  . COVID testing  . loss of taste  . Generalized Body Aches    Antonio Wong is a 29 y.o. male with no significant PMH presents to the ED with a 1 day history of body aches, cough, and loss of taste.  Patient reportedly woke up this morning and was alternating between fevers and chills.  He presents to the ED over concern for COVID-19.  He denies any obvious sick contacts.  He lives at home with his girlfriend who has been asymptomatic.  He denies any chest pain, difficulty breathing, headache or dizziness, abdominal discomfort, nausea or vomiting, or any other symptoms.  HPI     Past Medical History:  Diagnosis Date  . Asthma     There are no problems to display for this patient.   Past Surgical History:  Procedure Laterality Date  . HERNIA REPAIR    . TESTICLE SURGERY         No family history on file.  Social History   Tobacco Use  . Smoking status: Current Some Day Smoker    Types: Cigarettes  Substance Use Topics  . Alcohol use: No  . 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 Authorizing Provider  acetaminophen (TYLENOL) 500 MG tablet Take 1 tablet (500 mg total) by mouth every 6 (six) hours as needed. 01/15/19   Lorelee New, PA-C  ciprofloxacin (CIPRO) 500 MG tablet Take 1 tablet (500 mg total) by mouth 2 (two) times daily. 04/25/14   Tilden Fossa, MD  hydrocortisone (ANUSOL-HC) 25 MG suppository Place 1 suppository (25 mg total) rectally 2 (two) times daily as needed for hemorrhoids or anal itching. For 7 days 04/27/18   Fayrene Helper, PA-C  ibuprofen (ADVIL) 800 MG tablet Take 1 tablet (800 mg total) by mouth every 8 (eight) hours as needed for fever or moderate pain. 01/15/19    Lorelee New, PA-C  metroNIDAZOLE (FLAGYL) 500 MG tablet Take 1 tablet (500 mg total) by mouth 3 (three) times daily. 04/25/14   Tilden Fossa, MD  naproxen (NAPROSYN) 500 MG tablet Take 1 tablet (500 mg total) by mouth 2 (two) times daily. 07/21/14   [name], MD  polyethylene glycol (MIRALAX / GLYCOLAX) 17 g packet Take 17 g by mouth daily. 04/27/18   Fayrene Helper, PA-C  traMADol (ULTRAM) 50 MG tablet Take 1 tablet (50 mg total) by mouth every 6 (six) hours as needed. 07/21/14   [name], MD    Allergies    Patient has no known allergies.  Review of Systems   Review of Systems  All other systems reviewed and are negative.   Physical Exam Updated Vital Signs BP 132/85   Pulse 80   Temp 100.3 F (37.9 C) (Oral)   Resp 16   SpO2 98%   Physical Exam Vitals and nursing note reviewed. Exam conducted with a chaperone present.  Constitutional:      Appearance: Normal appearance.  HENT:     Head: Normocephalic and atraumatic.  Eyes:     General: No scleral icterus.    Conjunctiva/sclera: Conjunctivae normal.  Cardiovascular:     Rate and Rhythm: Normal rate and regular rhythm.  Pulses: Normal pulses.     Heart sounds: Normal heart sounds.  Pulmonary:     Effort: Pulmonary effort is normal. No respiratory distress.     Breath sounds: Normal breath sounds.  Musculoskeletal:     Cervical back: Normal range of motion and neck supple.  Skin:    General: Skin is dry.  Neurological:     Mental Status: He is alert and oriented to person, place, and time.     GCS: GCS eye subscore is 4. GCS verbal subscore is 5. GCS motor subscore is 6.  Psychiatric:        Mood and Affect: Mood normal.        Behavior: Behavior normal.        Thought Content: Thought content normal.     ED Results / Procedures / Treatments   Labs (all labs ordered are listed, but only abnormal results are displayed) Labs Reviewed  POC SARS CORONAVIRUS 2 AG -  ED - Abnormal; Notable for the  following components:      Result Value   SARS Coronavirus 2 Ag POSITIVE (*)    All other components within normal limits    EKG None  Radiology No results found.  Procedures Procedures (including critical care time)  Medications Ordered in ED Medications - No data to display  ED Course  I have reviewed the triage vital signs and the nursing notes.  Pertinent labs & imaging results that were available during my care of the patient were reviewed by me and considered in my medical decision making (see chart for details).    MDM Rules/Calculators/A&P                      Rapid COVID-19 testing is positive.  Will provide isolation precautions as well as discharge instructions.  Recommending over-the-counter medications for symptomatic relief.  Will prescribe patient Tylenol and ibuprofen to take as needed for his fevers, chills, and body aches.  Emphasized the importance of oral hydration and regular meals despite loss of taste.  Patient voiced understanding and is agreeable to the plan.  Understands orders no further work-up required at this time.  He feels stable and is in no acute distress.  His vital signs are all within normal limits, albeit borderline febrile.  Patient plans to have his girlfriend pick up a thermometer at [facility] so that he may monitor his temperature at home.  Antonio Wong was evaluated in Emergency Department on 01/15/2019 for the symptoms described in the history of present illness. He was evaluated in the context of the global COVID-19 pandemic, which necessitated consideration that the patient might be at risk for infection with the SARS-CoV-2 virus that causes COVID-19. Institutional protocols and algorithms that pertain to the evaluation of patients at risk for COVID-19 are in a state of rapid change based on information released by regulatory bodies including the CDC and federal and state organizations. These policies and algorithms were followed during the  patient's care in the ED.   Final Clinical Impression(s) / ED Diagnoses Final diagnoses:  COVID-19    Rx / DC Orders ED Discharge Orders         Ordered    acetaminophen (TYLENOL) 500 MG tablet  Every 6 hours PRN     01/15/19 1159    ibuprofen (ADVIL) 800 MG tablet  Every 8 hours PRN     01/15/19 1159           Xai Frerking, Rowe Clack,  PA-C 01/15/19 1159    Tegeler, Canary Brim, MD 01/15/19 1231

## 2019-01-15 NOTE — ED Notes (Signed)
Patient Alert and oriented to baseline. Stable and ambulatory to baseline. Patient verbalized understanding of the discharge instructions.  Patient belongings were taken by the patient.   

## 2019-01-15 NOTE — Discharge Instructions (Signed)
Please read the attachments on COVID-19.  Please maintain isolation precautions.  Take your prescriptions, as needed, for fevers, chills, and body aches.  Return to the ED or seek medical attention immediately for any chest pain, difficulty breathing, inability to tolerate food or liquid by mouth, uncontrolled nausea or vomiting, fevers or chills uncontrolled by Tylenol or ibuprofen, or any other new or worsening symptoms.

## 2019-01-15 NOTE — ED Triage Notes (Signed)
C/o loss of taste, body aches, and cough since last night.

## 2019-03-12 ENCOUNTER — Encounter (HOSPITAL_COMMUNITY): Payer: Self-pay

## 2019-03-12 ENCOUNTER — Other Ambulatory Visit: Payer: Self-pay

## 2019-03-12 ENCOUNTER — Emergency Department (HOSPITAL_COMMUNITY)
Admission: EM | Admit: 2019-03-12 | Discharge: 2019-03-12 | Disposition: A | Discharge status: Home / Self Care | Payer: Self-pay | Attending: Emergency Medicine | Admitting: Emergency Medicine

## 2019-03-12 DIAGNOSIS — Z791 Long term (current) use of non-steroidal anti-inflammatories (NSAID): Secondary | ICD-10-CM | POA: Insufficient documentation

## 2019-03-12 DIAGNOSIS — K0889 Other specified disorders of teeth and supporting structures: Secondary | ICD-10-CM | POA: Insufficient documentation

## 2019-03-12 DIAGNOSIS — J45909 Unspecified asthma, uncomplicated: Secondary | ICD-10-CM | POA: Insufficient documentation

## 2019-03-12 DIAGNOSIS — Z79899 Other long term (current) drug therapy: Secondary | ICD-10-CM | POA: Insufficient documentation

## 2019-03-12 DIAGNOSIS — F17210 Nicotine dependence, cigarettes, uncomplicated: Secondary | ICD-10-CM | POA: Insufficient documentation

## 2019-03-12 MED ORDER — PENICILLIN V POTASSIUM 500 MG PO TABS: 500.0000 mg | ORAL_TABLET | Freq: Four times a day (QID) | ORAL | 0 refills | Status: AC

## 2019-03-12 MED ORDER — LIDOCAINE VISCOUS HCL 2 % MT SOLN: 15.0000 mL | OROMUCOSAL | 0 refills | Status: AC | PRN

## 2019-03-12 NOTE — Discharge Instructions (Signed)
Take ibuprofen 3 times a day with meals.  Do not take other anti-inflammatories at the same time (Advil, Motrin, naproxen, Aleve). You may supplement with Tylenol if you need further pain control. Take antibiotics as prescribed.  Take the entire course, even if your symptoms improve. Use viscous lidocaine to help with pain and swelling. Follow-up with a dentist.  There is information at this in the paperwork. Return to the emergency room with any new, worsening, concerning symptoms.

## 2019-03-12 NOTE — ED Provider Notes (Signed)
[facility] EMERGENCY DEPARTMENT Provider Note   CSN: [id number] Arrival date & time: 03/12/19  1157     History Chief Complaint  Patient presents with  . Dental Pain    Antonio Wong is a 29 y.o. male presenting for evaluation of dental pain.  Patient states yesterday he was eating candy when he had worsened pain from his right upper tooth.  Patient states since then he has had intermittent bleeding.  He has had issues with his teeth for the past several years, saw dentist who recommended that it be pulled, but he has not done so.  He denies fevers, chills, pain elsewhere in the mouth.  He has no other medical problems besides asthma, takes medications daily.  He took 1 dose of Tylenol yesterday without improvement in symptoms, has not tried anything else.  HPI     Past Medical History:  Diagnosis Date  . Asthma     There are no problems to display for this patient.   Past Surgical History:  Procedure Laterality Date  . HERNIA REPAIR    . TESTICLE SURGERY         No family history on file.  Social History   Tobacco Use  . Smoking status: Current Some Day Smoker    Types: Cigarettes  . Smokeless tobacco: Never Used  Substance Use Topics  . Alcohol use: No  . 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 Authorizing Provider  acetaminophen (TYLENOL) 500 MG tablet Take 1 tablet (500 mg total) by mouth every 6 (six) hours as needed. 01/15/19   Corena Herter, PA-C  ciprofloxacin (CIPRO) 500 MG tablet Take 1 tablet (500 mg total) by mouth 2 (two) times daily. 04/25/14   Quintella Reichert, MD  hydrocortisone (ANUSOL-HC) 25 MG suppository Place 1 suppository (25 mg total) rectally 2 (two) times daily as needed for hemorrhoids or anal itching. For 7 days 04/27/18   Domenic Moras, PA-C  ibuprofen (ADVIL) 800 MG tablet Take 1 tablet (800 mg total) by mouth  every 8 (eight) hours as needed for fever or moderate pain. 01/15/19   Corena Herter, PA-C  lidocaine (XYLOCAINE) 2 % solution Use as directed 15 mLs in the mouth or throat as needed for mouth pain. 03/12/19   Valery Chance, PA-C  metroNIDAZOLE (FLAGYL) 500 MG tablet Take 1 tablet (500 mg total) by mouth 3 (three) times daily. 04/25/14   Quintella Reichert, MD  naproxen (NAPROSYN) 500 MG tablet Take 1 tablet (500 mg total) by mouth 2 (two) times daily. 0/09/38   Delora Fuel, MD  penicillin v potassium (VEETID) 500 MG tablet Take 1 tablet (500 mg total) by mouth 4 (four) times daily for 7 days. 03/12/19 03/19/19  Kimala Horne, PA-C  polyethylene glycol (MIRALAX / GLYCOLAX) 17 g packet Take 17 g by mouth daily. 04/27/18   Domenic Moras, PA-C  traMADol (ULTRAM) 50 MG tablet Take 1 tablet (50 mg total) by mouth every 6 (six) hours as needed. 1/82/99   Delora Fuel, MD    Allergies    Patient has no known allergies.  Review of Systems   Review of Systems  Constitutional: Negative for fever.  HENT: Positive for dental problem.     Physical Exam Updated Vital Signs BP (!) 136/97 (BP Location: Left Arm)   Pulse 84   Temp 98.9 F (37.2  C) (Oral)   Resp 16   SpO2 100%   Physical Exam Vitals and nursing note reviewed.  Constitutional:      General: He is not in acute distress.    Appearance: He is well-developed.  HENT:     Head: Normocephalic and atraumatic.     Mouth/Throat:      Comments: Overall poor dentition with multiple dental caries and cracked teeth.  Tenderness to palpation of the right upper tooth.  Tooth is cracked and decaying.  No bleeding at this time.  No facial swelling.  No trismus.  No difficulty handling secretions. Pulmonary:     Effort: Pulmonary effort is normal.  Abdominal:     General: There is no distension.  Musculoskeletal:        General: Normal range of motion.     Cervical back: Normal range of motion.  Skin:    General: Skin is warm.     Findings:  No rash.  Neurological:     Mental Status: He is alert and oriented to person, place, and time.     ED Results / Procedures / Treatments   Labs (all labs ordered are listed, but only abnormal results are displayed) Labs Reviewed - No data to display  EKG None  Radiology No results found.  Procedures Procedures (including critical care time)  Medications Ordered in ED Medications - No data to display  ED Course  I have reviewed the triage vital signs and the nursing notes.  Pertinent labs & imaging results that were available during my care of the patient were reviewed by me and considered in my medical decision making (see chart for details).    MDM Rules/Calculators/A&P                      Patient presenting for evaluation of right upper tooth pain.  Physical exam shows patient appears nontoxic.  No signs of systemic infection.  Tooth is tender, patient reports intermittent bleeding and worsened pain.  He has had issues with this tooth for several years.  Concern for early infection causing worsening pain.  Also consider trauma from eating candy.  Will cover with antibiotics and treat pain with anti-inflammatories and viscous lidocaine.  Will have patient follow-up with dentistry.  At this time, patient appears safe for discharge.  Return precautions given.  Patient states he understands and agrees to plan.  Final Clinical Impression(s) / ED Diagnoses Final diagnoses:  Pain, dental    Rx / DC Orders ED Discharge Orders         Ordered    penicillin v potassium (VEETID) 500 MG tablet  4 times daily     03/12/19 1252    lidocaine (XYLOCAINE) 2 % solution  As needed     03/12/19 1252           Marzella Miracle, PA-C 03/12/19 1252    Mancel Bale, MD 03/12/19 2022

## 2019-03-12 NOTE — ED Triage Notes (Signed)
Pt presents w/ right upper tooth pain, pt reports he broke his tooth last night

## 2020-07-16 ENCOUNTER — Encounter (HOSPITAL_COMMUNITY): Payer: Self-pay | Admitting: Emergency Medicine

## 2020-07-16 ENCOUNTER — Emergency Department (HOSPITAL_COMMUNITY)
Admission: EM | Admit: 2020-07-16 | Discharge: 2020-07-16 | Disposition: A | Discharge status: Home / Self Care | Payer: Self-pay | Attending: Emergency Medicine | Admitting: Emergency Medicine

## 2020-07-16 ENCOUNTER — Other Ambulatory Visit: Payer: Self-pay

## 2020-07-16 DIAGNOSIS — M79651 Pain in right thigh: Secondary | ICD-10-CM | POA: Insufficient documentation

## 2020-07-16 DIAGNOSIS — F17210 Nicotine dependence, cigarettes, uncomplicated: Secondary | ICD-10-CM | POA: Insufficient documentation

## 2020-07-16 DIAGNOSIS — H00012 Hordeolum externum right lower eyelid: Secondary | ICD-10-CM | POA: Insufficient documentation

## 2020-07-16 DIAGNOSIS — J45909 Unspecified asthma, uncomplicated: Secondary | ICD-10-CM | POA: Insufficient documentation

## 2020-07-16 MED ORDER — ERYTHROMYCIN 5 MG/GM OP OINT: TOPICAL_OINTMENT | Freq: Three times a day (TID) | OPHTHALMIC | 0 refills | Status: AC

## 2020-07-16 MED ORDER — ERYTHROMYCIN 5 MG/GM OP OINT: TOPICAL_OINTMENT | Freq: Three times a day (TID) | OPHTHALMIC | 0 refills | Status: DC

## 2020-07-16 NOTE — ED Provider Notes (Signed)
[facility] EMERGENCY DEPARTMENT Provider Note   CSN: [id number] Arrival date & time: 07/16/20  1739     History Chief Complaint  Patient presents with   Eye Pain    Antonio Wong is a 30 y.o. male.  HPI  Patient with no significant medical history presents with chief complaint of right thigh pain.  Patient states that started yesterday, states there is a bump on his bottom eyelid that is causing pain, he states he has slight blurry vision but thinks it from the tears from his eyes, he denies recent trauma to the area, has never experienced in the past, does not wear contact lenses..  He has no other symptoms, he does not endorse new congestion, sore throat, cough, chest pain, abdominal pain.  Past Medical History:  Diagnosis Date   Asthma     There are no problems to display for this patient.   Past Surgical History:  Procedure Laterality Date   HERNIA REPAIR     TESTICLE SURGERY         No family history on file.  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 Authorizing Provider  acetaminophen (TYLENOL) 500 MG tablet Take 1 tablet (500 mg total) by mouth every 6 (six) hours as needed. 01/15/19   Lorelee New, PA-C  ciprofloxacin (CIPRO) 500 MG tablet Take 1 tablet (500 mg total) by mouth 2 (two) times daily. 04/25/14   Tilden Fossa, MD  erythromycin ophthalmic ointment Place into the right eye 3 (three) times daily for 5 days. Place a 1/2 inch ribbon of ointment into the lower eyelid. 07/16/20 07/21/20  Carroll Sage, PA-C  hydrocortisone (ANUSOL-HC) 25 MG suppository Place 1 suppository (25 mg total) rectally 2 (two) times daily as needed for hemorrhoids or anal  itching. For 7 days 04/27/18   Fayrene Helper, PA-C  ibuprofen (ADVIL) 800 MG tablet Take 1 tablet (800 mg total) by mouth every 8 (eight) hours as needed for fever or moderate pain. 01/15/19   Lorelee New, PA-C  lidocaine (XYLOCAINE) 2 % solution Use as directed 15 mLs in the mouth or throat as needed for mouth pain. 03/12/19   Caccavale, Sophia, PA-C  metroNIDAZOLE (FLAGYL) 500 MG tablet Take 1 tablet (500 mg total) by mouth 3 (three) times daily. 04/25/14   Tilden Fossa, MD  naproxen (NAPROSYN) 500 MG tablet Take 1 tablet (500 mg total) by mouth 2 (two) times daily. 07/21/14   [name], MD  polyethylene glycol (MIRALAX / GLYCOLAX) 17 g packet Take 17 g by mouth daily. 04/27/18   Fayrene Helper, PA-C  traMADol (ULTRAM) 50 MG tablet Take 1 tablet (50 mg total) by mouth every 6 (six) hours as needed. 07/21/14   [name], MD    Allergies    Patient has no known allergies.  Review of Systems   Review of Systems  Constitutional:  Negative for chills and fever.  HENT:  Negative for congestion.   Eyes:  Positive for pain, discharge and visual disturbance. Negative for redness and itching.  Respiratory:  Negative for shortness of breath.   Cardiovascular:  Negative for chest  pain.  Gastrointestinal:  Negative for abdominal pain.  Genitourinary:  Negative for enuresis.  Musculoskeletal:  Negative for back pain.  Skin:  Negative for rash.  Neurological:  Negative for dizziness.  Hematological:  Does not bruise/bleed easily.   Physical Exam Updated Vital Signs BP 118/78   Pulse 70   Temp 98.6 F (37 C) (Oral)   Resp 15   Ht 5\' 6"  (1.676 m)   Wt 113.4 kg   SpO2 100%   BMI 40.35 kg/m   Physical Exam Vitals and nursing note reviewed.  Constitutional:      General: He is not in acute distress.    Appearance: Normal appearance. He is not ill-appearing or diaphoretic.  HENT:     Head: Normocephalic and atraumatic.     Nose: No congestion or rhinorrhea.  Eyes:     General: No  scleral icterus.       Right eye: No discharge.        Left eye: No discharge.     Extraocular Movements: Extraocular movements intact.     Conjunctiva/sclera: Conjunctivae normal.     Pupils: Pupils are equal, round, and reactive to light.     Comments: Patient's right eye was visualized he has a noted stye on his right bottom eyelid, there is slight erythema present, no drainage or discharge present, no sclera injection, no dendritic lesion, no blood noted on the anterior chamber the eye, PERRLA, EOMs fully intact, no proptosis present.  Cardiovascular:     Rate and Rhythm: Normal rate and regular rhythm.  Pulmonary:     Effort: Pulmonary effort is normal.  Musculoskeletal:     Cervical back: Neck supple.     Right lower leg: No edema.     Left lower leg: No edema.  Skin:    General: Skin is warm and dry.     Coloration: Skin is not jaundiced or pale.  Neurological:     Mental Status: He is alert.  Psychiatric:        Mood and Affect: Mood normal.    ED Results / Procedures / Treatments   Labs (all labs ordered are listed, but only abnormal results are displayed) Labs Reviewed - No data to display  EKG None  Radiology No results found.  Procedures Procedures   Medications Ordered in ED Medications - No data to display  ED Course  I have reviewed the triage vital signs and the nursing notes.  Pertinent labs & imaging results that were available during my care of the patient were reviewed by me and considered in my medical decision making (see chart for details).    MDM Rules/Calculators/A&P                         Initial impression-patient presents with right eye pain.  He is alert, does not appear acute stress, vital signs reassuring.  Work-up-due to well-appearing patient, benign for exam, further lab or imaging ordered at this time.  Rule out- I have low suspicion for orbital cellulitis or periseptal cellulitis as patient had no pain with EOMs, there is no  induration, fluctuance noted on exam around eyes, no proptosis.  Low suspicion for keratitis as patient does not wear contacts, eyes were visualized there is no visible dendritic lesion noted, there is no severe amount of purulent discharge noted.  Low suspicion for hyphema as patient denies recent trauma to the area no blood noted in the anterior chamber.  Low  suspicion for corneal abrasion as he denies gradient sensation in his eyes, denies any trauma to his eyes.     Plan-  Right eye pain-suspect patient suffering from a stye, will start him on antibiotics, recommend warm compresses, follow-up with ophthalmology for further evaluation.  Vital signs have remained stable, no indication for hospital admission.  Patient given at home care as well strict return precautions.  Patient verbalized that they understood agreed to said plan.  Final Clinical Impression(s) / ED Diagnoses Final diagnoses:  Hordeolum externum of right lower eyelid    Rx / DC Orders ED Discharge Orders          Ordered    erythromycin ophthalmic ointment  3 times daily,   Status:  Discontinued        07/16/20 1915    erythromycin ophthalmic ointment  3 times daily        07/16/20 1919             Barnie Del 07/16/20 1920    Melene Plan, DO 07/16/20 1931

## 2020-07-16 NOTE — ED Triage Notes (Signed)
Pt endorses right eye pain and swelling since yesterday.

## 2020-07-16 NOTE — Discharge Instructions (Signed)
You have a stye of your right eye, I have started you on antibiotics please take as prescribed.  Please apply warm compress to the area this will help with the healing process do not try to pop the pimple on your eye.  Please follow-up with ophthalmology for further evaluation.  Come back to the emergency department if you develop chest pain, shortness of breath, severe abdominal pain, uncontrolled nausea, vomiting, diarrhea.
